# Patient Record
Sex: Female | Born: 2000 | Race: White | Hispanic: No | Smoking: Current every day smoker
Health system: Southern US, Community
[De-identification: ages and names within clinical notes are randomized; demographics above are authoritative.]

## PROBLEM LIST (undated history)

## (undated) DIAGNOSIS — S93409A Sprain of unspecified ligament of unspecified ankle, initial encounter: Secondary | ICD-10-CM

## (undated) DIAGNOSIS — F99 Mental disorder, not otherwise specified: Secondary | ICD-10-CM

## (undated) HISTORY — DX: Mental disorder, not otherwise specified: F99

## (undated) HISTORY — PX: TONSILLECTOMY AND ADENOIDECTOMY: SUR1326

---
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
## 2022-01-02 ENCOUNTER — Encounter (HOSPITAL_COMMUNITY): Payer: Self-pay

## 2022-01-02 ENCOUNTER — Emergency Department (HOSPITAL_COMMUNITY)
Admission: EM | Admit: 2022-01-02 | Discharge: 2022-01-03 | Disposition: A | Discharge status: Home / Self Care | Payer: Medicaid Other | Attending: Emergency Medicine | Admitting: Emergency Medicine

## 2022-01-02 ENCOUNTER — Other Ambulatory Visit: Payer: Self-pay

## 2022-01-02 DIAGNOSIS — U071 COVID-19: Secondary | ICD-10-CM | POA: Diagnosis not present

## 2022-01-02 DIAGNOSIS — M533 Sacrococcygeal disorders, not elsewhere classified: Secondary | ICD-10-CM

## 2022-01-02 DIAGNOSIS — M546 Pain in thoracic spine: Secondary | ICD-10-CM | POA: Diagnosis present

## 2022-01-02 NOTE — ED Triage Notes (Signed)
Pt reports lower back pain that started today.  Pt had a positive at home covid test.  Pt is alert and oriented.  Resp even and unlabored.  Reports fever.  Denies taking any meds for fever.  Reports took cough meds pta.   ?

## 2022-01-03 DIAGNOSIS — R509 Fever, unspecified: Secondary | ICD-10-CM | POA: Diagnosis not present

## 2022-01-03 DIAGNOSIS — U071 COVID-19: Secondary | ICD-10-CM | POA: Diagnosis not present

## 2022-01-03 LAB — CBC WITH DIFFERENTIAL/PLATELET
Abs Immature Granulocytes: 0.02 10*3/uL (ref 0.00–0.07)
Basophils Absolute: 0.1 10*3/uL (ref 0.0–0.1)
Basophils Relative: 1 %
Eosinophils Absolute: 0.1 10*3/uL (ref 0.0–0.5)
Eosinophils Relative: 1 %
HCT: 35.8 % — ABNORMAL LOW (ref 36.0–46.0)
Hemoglobin: 12 g/dL (ref 12.0–15.0)
Immature Granulocytes: 0 %
Lymphocytes Relative: 5 %
Lymphs Abs: 0.3 10*3/uL — ABNORMAL LOW (ref 0.7–4.0)
MCH: 28.2 pg (ref 26.0–34.0)
MCHC: 33.5 g/dL (ref 30.0–36.0)
MCV: 84 fL (ref 80.0–100.0)
Monocytes Absolute: 0.9 10*3/uL (ref 0.1–1.0)
Monocytes Relative: 12 %
Neutro Abs: 5.8 10*3/uL (ref 1.7–7.7)
Neutrophils Relative %: 81 %
Platelets: 242 10*3/uL (ref 150–400)
RBC: 4.26 MIL/uL (ref 3.87–5.11)
RDW: 13.3 % (ref 11.5–15.5)
WBC: 7.1 10*3/uL (ref 4.0–10.5)
nRBC: 0 % (ref 0.0–0.2)

## 2022-01-03 LAB — URINALYSIS, ROUTINE W REFLEX MICROSCOPIC
Bilirubin Urine: NEGATIVE
Glucose, UA: NEGATIVE mg/dL
Ketones, ur: 5 mg/dL — AB
Leukocytes,Ua: NEGATIVE
Nitrite: NEGATIVE
Protein, ur: NEGATIVE mg/dL
Specific Gravity, Urine: 1.012 (ref 1.005–1.030)
pH: 6 (ref 5.0–8.0)

## 2022-01-03 LAB — COMPREHENSIVE METABOLIC PANEL
ALT: 16 U/L (ref 0–44)
AST: 20 U/L (ref 15–41)
Albumin: 4.6 g/dL (ref 3.5–5.0)
Alkaline Phosphatase: 61 U/L (ref 38–126)
Anion gap: 8 (ref 5–15)
BUN: 8 mg/dL (ref 6–20)
CO2: 22 mmol/L (ref 22–32)
Calcium: 9.3 mg/dL (ref 8.9–10.3)
Chloride: 105 mmol/L (ref 98–111)
Creatinine, Ser: 0.84 mg/dL (ref 0.44–1.00)
GFR, Estimated: >60 mL/min (ref >60)
Glucose, Bld: 101 mg/dL — ABNORMAL HIGH (ref 70–99)
Potassium: 3.8 mmol/L (ref 3.5–5.1)
Sodium: 135 mmol/L (ref 135–145)
Total Bilirubin: 1 mg/dL (ref 0.3–1.2)
Total Protein: 8 g/dL (ref 6.5–8.1)

## 2022-01-03 LAB — RESP PANEL BY RT-PCR (FLU A&B, COVID) ARPGX2
Influenza A by PCR: NEGATIVE
Influenza B by PCR: NEGATIVE
SARS Coronavirus 2 by RT PCR: POSITIVE — AB

## 2022-01-03 LAB — I-STAT BETA HCG BLOOD, ED (MC, WL, AP ONLY): I-stat hCG, quantitative: <5 m[IU]/mL (ref <5)

## 2022-01-03 MED ORDER — LACTATED RINGERS IV BOLUS
1000.0000 mL | Freq: Once | INTRAVENOUS | Status: AC
  Administered 2022-01-03: 1000 mL via INTRAVENOUS

## 2022-01-03 MED ORDER — KETOROLAC TROMETHAMINE 30 MG/ML IJ SOLN
15.0000 mg | Freq: Once | INTRAMUSCULAR | Status: AC
  Administered 2022-01-03: 15 mg via INTRAVENOUS
  Filled 2022-01-03: qty 1

## 2022-01-03 NOTE — ED Provider Notes (Signed)
?[place] ?Provider Note ? ? ?CSN: [id number] ?Arrival date & time: 01/02/22  2251 ? ?  ? ?History ? ?Chief Complaint  ?Patient presents with  ? Back Pain  ? ? ?Stacey Bates is a 21 y.o. female. ? ?21 year old female who presents to the ER today for back pain.  Patient states that she was doing okay not feeling her best self not eating and drink as much is normal today.  She had breakfast but nothing else like that.  She works as a Educational psychologist.  She was at work when she had worsening body aches and was found of a fever her family took her COVID test was positive.  She continued to feel bad so she came home.  She is up for few hours and then called her parents because her lower back hurts significantly so she wanted to come to the hospital.  She states that she has pain in the lower mid back that is pretty severe in nature.  Worse with twisting and movement.  Better with rest.  Did not try thing at home for symptoms.  No urinary symptoms, vaginal symptoms.  Without the family in the room I discussed with her no alcohol, drugs, tobacco or other associated symptoms. ? ? ?Back Pain ? ?  ? ?Home Medications ?Prior to Admission medications   ?Medication Sig Start Date End Date Taking? Authorizing Provider  ?doxepin (SINEQUAN) 10 MG capsule Take 10 mg by mouth at bedtime. 08/03/19   [provider]  ?escitalopram (LEXAPRO) 20 MG tablet Take 20 mg by mouth daily. 08/03/19   [provider]  ?etonogestrel (NEXPLANON) 68 MG IMPL implant 1 each by Subdermal route once.    [provider]  ?lamoTRIgine (LAMICTAL) 25 MG tablet Take 50 mg by mouth at bedtime. 08/31/19   [provider]  ?traZODone (DESYREL) 100 MG tablet Take 100 mg by mouth at bedtime. 08/31/19   [provider]  ?   ? ?Allergies    ?Cephalosporins and Sulfa antibiotics   ? ?Review of Systems   ?Review of Systems  ?Musculoskeletal:  Positive for back pain.  ? ?Physical Exam ?Updated Vital  Signs ?BP 133/75 (BP Location: Right Arm)   Pulse 93   Temp (!) 100.8 ?F (38.2 ?C) (Oral)   Resp 19   Ht 5\' 3"  (1.6 m)   Wt 49.9 kg   SpO2 98%   BMI 19.49 kg/m?  ?Physical Exam ?Vitals and nursing note reviewed.  ?Constitutional:   ?   Appearance: She is well-developed.  ?HENT:  ?   Head: Normocephalic and atraumatic.  ?   Mouth/Throat:  ?   Mouth: Mucous membranes are moist.  ?   Pharynx: Oropharynx is clear.  ?Eyes:  ?   Pupils: Pupils are equal, round, and reactive to light.  ?Cardiovascular:  ?   Rate and Rhythm: Normal rate and regular rhythm.  ?Pulmonary:  ?   Effort: No respiratory distress.  ?   Breath sounds: No stridor.  ?Abdominal:  ?   General: Abdomen is flat. There is no distension.  ?Musculoskeletal:     ?   General: No swelling or tenderness. Normal range of motion.  ?   Cervical back: Normal range of motion.  ?Skin: ?   General: Skin is warm and dry.  ?Neurological:  ?   General: No focal deficit present.  ?   Mental Status: She is alert.  ? ? ?ED Results / Procedures / Treatments   ?  Labs ?(all labs ordered are listed, but only abnormal results are displayed) ?Labs Reviewed  ?RESP PANEL BY RT-PCR (FLU A&B, COVID) ARPGX2 - Abnormal; Notable for the following components:  ?    Result Value  ? SARS Coronavirus 2 by RT PCR POSITIVE (*)   ? All other components within normal limits  ?CBC WITH DIFFERENTIAL/PLATELET - Abnormal; Notable for the following components:  ? HCT 35.8 (*)   ? Lymphs Abs 0.3 (*)   ? All other components within normal limits  ?COMPREHENSIVE METABOLIC PANEL - Abnormal; Notable for the following components:  ? Glucose, Bld 101 (*)   ? All other components within normal limits  ?URINALYSIS, ROUTINE W REFLEX MICROSCOPIC - Abnormal; Notable for the following components:  ? Hgb urine dipstick SMALL (*)   ? Ketones, ur 5 (*)   ? Bacteria, UA RARE (*)   ? All other components within normal limits  ?I-STAT BETA HCG BLOOD, ED ([facility], [facility], [facility] ONLY)  ? ? ?EKG ?None ? ?Radiology ?No results  found. ? ?Procedures ?Procedures  ? ? ?Medications Ordered in ED ?Medications  ?lactated ringers bolus 1,000 mL (0 mLs Intravenous Stopped 01/03/22 0333)  ?ketorolac (TORADOL) 30 MG/ML injection 15 mg (15 mg Intravenous Given 01/03/22 0157)  ?lactated ringers bolus 1,000 mL (0 mLs Intravenous Stopped 01/03/22 0417)  ? ? ?ED Course/ Medical Decision Making/ A&P ?  ?                        ?Medical Decision Making ?Amount and/or Complexity of Data Reviewed ?Labs: ordered. ? ?Risk ?Prescription drug management. ? ? ?Does have covid.  No trauma to suggest need for imaging.  No GI symptoms to suggest constipation, abscess, colitis or other GI issues.  No rashes.  No urinary or vaginal symptoms to suggest ectopic pregnancy.  Could very well just be muscular soreness.  Probably dehydrated from not eating drinking much today and having a fever with COVID.  We will give some fluids and some Toradol and reassess. ? ?Reassessment patient symptoms are significantly improved.  Still negative to urine sample.  Pregnancy test is negative.  Rest of work-up is pretty reassuring. Stable for discharge.  ? ? ?Final Clinical Impression(s) / ED Diagnoses ?Final diagnoses:  ?Sacral pain  ?COVID-19  ? ? ?Rx / DC Orders ?ED Discharge Orders   ? ? None  ? ?  ? ? ?  ?Merrily Pew, MD ?01/03/22 [phone] ? ?

## 2022-02-17 ENCOUNTER — Ambulatory Visit: Payer: Medicaid Other | Admitting: Adult Health

## 2022-02-17 DIAGNOSIS — M25511 Pain in right shoulder: Secondary | ICD-10-CM | POA: Diagnosis not present

## 2022-02-17 DIAGNOSIS — M25512 Pain in left shoulder: Secondary | ICD-10-CM | POA: Diagnosis not present

## 2022-02-17 DIAGNOSIS — L559 Sunburn, unspecified: Secondary | ICD-10-CM | POA: Diagnosis not present

## 2022-02-23 ENCOUNTER — Emergency Department (HOSPITAL_COMMUNITY)
Admission: EM | Admit: 2022-02-23 | Discharge: 2022-02-24 | Disposition: A | Discharge status: Home / Self Care | Payer: Medicaid Other | Attending: Emergency Medicine | Admitting: Emergency Medicine

## 2022-02-23 ENCOUNTER — Other Ambulatory Visit: Payer: Self-pay

## 2022-02-23 ENCOUNTER — Encounter (HOSPITAL_COMMUNITY): Payer: Self-pay

## 2022-02-23 ENCOUNTER — Emergency Department (HOSPITAL_COMMUNITY): Payer: Medicaid Other

## 2022-02-23 DIAGNOSIS — D72829 Elevated white blood cell count, unspecified: Secondary | ICD-10-CM | POA: Insufficient documentation

## 2022-02-23 DIAGNOSIS — R109 Unspecified abdominal pain: Secondary | ICD-10-CM | POA: Diagnosis present

## 2022-02-23 DIAGNOSIS — N200 Calculus of kidney: Secondary | ICD-10-CM | POA: Insufficient documentation

## 2022-02-23 LAB — COMPREHENSIVE METABOLIC PANEL
ALT: 14 U/L (ref 0–44)
AST: 17 U/L (ref 15–41)
Albumin: 4.1 g/dL (ref 3.5–5.0)
Alkaline Phosphatase: 52 U/L (ref 38–126)
Anion gap: 7 (ref 5–15)
BUN: 9 mg/dL (ref 6–20)
CO2: 25 mmol/L (ref 22–32)
Calcium: 9.1 mg/dL (ref 8.9–10.3)
Chloride: 106 mmol/L (ref 98–111)
Creatinine, Ser: 0.93 mg/dL (ref 0.44–1.00)
GFR, Estimated: >60 mL/min (ref >60)
Glucose, Bld: 93 mg/dL (ref 70–99)
Potassium: 3.5 mmol/L (ref 3.5–5.1)
Sodium: 138 mmol/L (ref 135–145)
Total Bilirubin: 0.6 mg/dL (ref 0.3–1.2)
Total Protein: 7.4 g/dL (ref 6.5–8.1)

## 2022-02-23 LAB — URINALYSIS, ROUTINE W REFLEX MICROSCOPIC
Bilirubin Urine: NEGATIVE
Glucose, UA: NEGATIVE mg/dL
Ketones, ur: NEGATIVE mg/dL
Leukocytes,Ua: NEGATIVE
Nitrite: NEGATIVE
Protein, ur: NEGATIVE mg/dL
Specific Gravity, Urine: 1.017 (ref 1.005–1.030)
pH: 5 (ref 5.0–8.0)

## 2022-02-23 LAB — CBC WITH DIFFERENTIAL/PLATELET
Abs Immature Granulocytes: 0.04 10*3/uL (ref 0.00–0.07)
Basophils Absolute: 0.1 10*3/uL (ref 0.0–0.1)
Basophils Relative: 1 %
Eosinophils Absolute: 0.3 10*3/uL (ref 0.0–0.5)
Eosinophils Relative: 2 %
HCT: 36 % (ref 36.0–46.0)
Hemoglobin: 12 g/dL (ref 12.0–15.0)
Immature Granulocytes: 0 %
Lymphocytes Relative: 20 %
Lymphs Abs: 2.3 10*3/uL (ref 0.7–4.0)
MCH: 28.8 pg (ref 26.0–34.0)
MCHC: 33.3 g/dL (ref 30.0–36.0)
MCV: 86.5 fL (ref 80.0–100.0)
Monocytes Absolute: 0.9 10*3/uL (ref 0.1–1.0)
Monocytes Relative: 7 %
Neutro Abs: 7.9 10*3/uL — ABNORMAL HIGH (ref 1.7–7.7)
Neutrophils Relative %: 70 %
Platelets: 287 10*3/uL (ref 150–400)
RBC: 4.16 MIL/uL (ref 3.87–5.11)
RDW: 13.3 % (ref 11.5–15.5)
WBC: 11.5 10*3/uL — ABNORMAL HIGH (ref 4.0–10.5)
nRBC: 0 % (ref 0.0–0.2)

## 2022-02-23 LAB — PREGNANCY, URINE: Preg Test, Ur: NEGATIVE

## 2022-02-23 LAB — LIPASE, BLOOD: Lipase: 28 U/L (ref 11–51)

## 2022-02-23 MED ORDER — HYDROCODONE-ACETAMINOPHEN 5-325 MG PO TABS: 1.0000 | ORAL_TABLET | Freq: Four times a day (QID) | ORAL | 0 refills | Status: DC | PRN

## 2022-02-23 MED ORDER — IOHEXOL 300 MG/ML  SOLN
75.0000 mL | Freq: Once | INTRAMUSCULAR | Status: AC | PRN
Start: 2022-02-23 — End: 2022-02-23
  Administered 2022-02-23: 75 mL via INTRAVENOUS

## 2022-02-23 MED ORDER — FENTANYL CITRATE PF 50 MCG/ML IJ SOSY
50.0000 ug | PREFILLED_SYRINGE | Freq: Once | INTRAMUSCULAR | Status: AC
  Administered 2022-02-23: 50 ug via INTRAVENOUS
  Filled 2022-02-23: qty 1

## 2022-02-23 MED ORDER — TAMSULOSIN HCL 0.4 MG PO CAPS
0.4000 mg | ORAL_CAPSULE | Freq: Once | ORAL | Status: AC
  Administered 2022-02-23: 0.4 mg via ORAL
  Filled 2022-02-23: qty 1

## 2022-02-23 MED ORDER — TAMSULOSIN HCL 0.4 MG PO CAPS: 0.4000 mg | ORAL_CAPSULE | Freq: Every day | ORAL | 0 refills | Status: DC

## 2022-02-23 MED ORDER — KETOROLAC TROMETHAMINE 30 MG/ML IJ SOLN
30.0000 mg | Freq: Once | INTRAMUSCULAR | Status: AC
Start: 2022-02-23 — End: 2022-02-23
  Administered 2022-02-23: 30 mg via INTRAVENOUS
  Filled 2022-02-23: qty 1

## 2022-02-23 MED ORDER — ONDANSETRON HCL 4 MG/2ML IJ SOLN
4.0000 mg | Freq: Once | INTRAMUSCULAR | Status: AC
  Administered 2022-02-23: 4 mg via INTRAVENOUS
  Filled 2022-02-23: qty 2

## 2022-02-23 MED ORDER — ONDANSETRON HCL 4 MG PO TABS: 4.0000 mg | ORAL_TABLET | Freq: Three times a day (TID) | ORAL | 0 refills | Status: DC | PRN

## 2022-02-23 MED ORDER — SODIUM CHLORIDE 0.9 % IV BOLUS
1000.0000 mL | Freq: Once | INTRAVENOUS | Status: AC
  Administered 2022-02-23: 1000 mL via INTRAVENOUS

## 2022-02-23 NOTE — Discharge Instructions (Addendum)
Please return to the ED with any new symptoms such as inability to urinate, decreased urine stream, fevers Please follow-up with urologist, Dr. Ronne Binning, for further management Please continue to push fluids and remain hydrated.  Urinating often helps the stone move along. Please read attached informational guide concerning dietary guidelines to help prevent kidney stones as well as informational brochure on kidney stones Please take tamsulosin 1 time daily

## 2022-02-23 NOTE — ED Provider Notes (Addendum)
[name] EMERGENCY DEPARTMENT Provider Note   CSN: [id number] Arrival date & time: 02/23/22  1932     History  Chief Complaint  Patient presents with   Flank Pain    Pelvic    Stacey Bates is a 21 y.o. female with no relevant medical history.  The patient presents to the ED for evaluation of left-sided groin, flank pain described as cramping.  Patient reports that beginning Friday she woke up and had left-sided groin pain causing her to have 4 episodes of nausea and vomiting.  The patient states that this pain decreased throughout the day, she went to work and worked a full shift.  The patient reports that later that night the pain returned and ever since Friday night the pain has been constant.  The patient states that the pain has gradually worked its way around her left flank localizing on the left side of her back.  Patient states that her mother has a history of ovarian cyst however the patient denies any history of ovarian cyst.  Patient denies history of kidney stones.  The patient reports that she recently got off of her menstrual period on Wednesday.  Patient is also endorsing vaginal discharge, she states that the vaginal discharge is not colored, does not have an odor, is typical for her.  Patient denies any fevers, bodyaches or chills, diarrhea, decreased urine, blood in urine, dysuria, dyspareunia.   Flank Pain Pertinent negatives include no abdominal pain.       Home Medications Prior to Admission medications   Medication Sig Start Date End Date Taking? Authorizing Provider  HYDROcodone-acetaminophen (NORCO/VICODIN) 5-325 MG tablet Take 1 tablet by mouth every 6 (six) hours as needed for severe pain. 02/23/22  Yes Al Decant, PA-C  ondansetron (ZOFRAN) 4 MG tablet Take 1 tablet (4 mg total) by mouth every 8 (eight) hours as needed for nausea or vomiting. 02/23/22  Yes Al Decant, PA-C  tamsulosin (FLOMAX) 0.4 MG CAPS capsule Take 1 capsule  (0.4 mg total) by mouth daily after breakfast. 02/23/22  Yes Al Decant, PA-C  doxepin (SINEQUAN) 10 MG capsule Take 10 mg by mouth at bedtime. 08/03/19   [provider]  escitalopram (LEXAPRO) 20 MG tablet Take 20 mg by mouth daily. 08/03/19   [provider]  etonogestrel (NEXPLANON) 68 MG IMPL implant 1 each by Subdermal route once.    [provider]  lamoTRIgine (LAMICTAL) 25 MG tablet Take 50 mg by mouth at bedtime. 08/31/19   [provider]  traZODone (DESYREL) 100 MG tablet Take 100 mg by mouth at bedtime. 08/31/19   [provider]      Allergies    Cephalosporins and Sulfa antibiotics    Review of Systems   Review of Systems  Constitutional:  Negative for chills and fever.  Gastrointestinal:  Positive for nausea and vomiting. Negative for abdominal pain and diarrhea.  Genitourinary:  Positive for flank pain and vaginal discharge. Negative for decreased urine volume, difficulty urinating, dyspareunia, dysuria and hematuria.  All other systems reviewed and are negative.   Physical Exam Updated Vital Signs BP 111/72   Pulse 83   Temp 98.6 F (37 C) (Oral)   Resp 18   Ht 5\' 3"  (1.6 m)   Wt 50 kg   LMP 02/20/2022   SpO2 96%   BMI 19.53 kg/m  Physical Exam Vitals and nursing note reviewed.  Constitutional:      General: She is  not in acute distress.    Appearance: Normal appearance. She is not ill-appearing, toxic-appearing or diaphoretic.  HENT:     Head: Normocephalic and atraumatic.     Nose: Nose normal. No congestion.     Mouth/Throat:     Mouth: Mucous membranes are moist.     Pharynx: Oropharynx is clear. No oropharyngeal exudate or posterior oropharyngeal erythema.  Eyes:     Extraocular Movements: Extraocular movements intact.     Conjunctiva/sclera: Conjunctivae normal.     Pupils: Pupils are equal, round, and reactive to light.  Cardiovascular:     Rate and Rhythm: Normal rate and regular rhythm.   Pulmonary:     Effort: Pulmonary effort is normal.     Breath sounds: Normal breath sounds. No wheezing.  Abdominal:     General: Abdomen is flat. Bowel sounds are normal. There is no distension.     Palpations: Abdomen is soft.     Tenderness: There is no abdominal tenderness. There is left CVA tenderness. There is no right CVA tenderness or guarding.  Musculoskeletal:     Cervical back: Normal range of motion and neck supple. No rigidity or tenderness.  Skin:    General: Skin is warm and dry.     Capillary Refill: Capillary refill takes less than 2 seconds.  Neurological:     Mental Status: She is alert and oriented to person, place, and time.     ED Results / Procedures / Treatments   Labs (all labs ordered are listed, but only abnormal results are displayed) Labs Reviewed  CBC WITH DIFFERENTIAL/PLATELET - Abnormal; Notable for the following components:      Result Value   WBC 11.5 (*)    Neutro Abs 7.9 (*)    All other components within normal limits  URINALYSIS, ROUTINE W REFLEX MICROSCOPIC - Abnormal; Notable for the following components:   Hgb urine dipstick MODERATE (*)    Bacteria, UA RARE (*)    All other components within normal limits  URINE CULTURE  PREGNANCY, URINE  COMPREHENSIVE METABOLIC PANEL  LIPASE, BLOOD    EKG None  Radiology CT ABDOMEN PELVIS W CONTRAST  Result Date: 02/23/2022 CLINICAL DATA:  Left flank pain and lower left pelvic pain, kidney stone suspected. EXAM: CT ABDOMEN AND PELVIS WITH CONTRAST TECHNIQUE: Multidetector CT imaging of the abdomen and pelvis was performed using the standard protocol following bolus administration of intravenous contrast. RADIATION DOSE REDUCTION: This exam was performed according to the departmental dose-optimization program which includes automated exposure control, adjustment of the mA and/or kV according to patient size and/or use of iterative reconstruction technique. CONTRAST:  59mL OMNIPAQUE IOHEXOL 300  MG/ML  SOLN COMPARISON:  None Available. FINDINGS: Lower chest: No acute abnormality. Hepatobiliary: No focal liver abnormality is seen. No gallstones, gallbladder wall thickening, or biliary dilatation. Pancreas: Unremarkable. No pancreatic ductal dilatation or surrounding inflammatory changes. Spleen: Normal in size without focal abnormality. Adrenals/Urinary Tract: Adrenal glands are unremarkable. The kidneys enhance symmetrically. No renal calculus or hydronephrosis bilaterally. A 5 mm calcification is present in the anticipated region of the mid left ureter without evidence of obstructive uropathy. Stomach/Bowel: Stomach is within normal limits. Appendix appears normal. No evidence of bowel wall thickening, distention, or inflammatory changes. No free air or pneumatosis. Vascular/Lymphatic: No significant vascular findings are present. No enlarged abdominal or pelvic lymph nodes. Reproductive: Uterus and bilateral adnexa are unremarkable. Other: A trace amount of free fluid is noted in the pelvis which is likely physiologic. Musculoskeletal: No  acute or significant osseous findings. IMPRESSION: 5 mm calculus in the anticipated region of the mid left ureter with no evidence of obstructive uropathy. Electronically Signed   By: Thornell Sartorius M.D.   On: 02/23/2022 22:47    Procedures Procedures   Medications Ordered in ED Medications  tamsulosin (FLOMAX) capsule 0.4 mg (has no administration in time range)  fentaNYL (SUBLIMAZE) injection 50 mcg (has no administration in time range)  sodium chloride 0.9 % bolus 1,000 mL (1,000 mLs Intravenous New Bag/Given 02/23/22 2220)  ondansetron (ZOFRAN) injection 4 mg (4 mg Intravenous Given 02/23/22 2221)  ketorolac (TORADOL) 30 MG/ML injection 30 mg (30 mg Intravenous Given 02/23/22 2221)  iohexol (OMNIPAQUE) 300 MG/ML solution 75 mL (75 mLs Intravenous Contrast Given 02/23/22 2230)    ED Course/ Medical Decision Making/ A&P                           Medical  Decision Making Amount and/or Complexity of Data Reviewed Labs: ordered. Radiology: ordered.  Risk Prescription drug management.   21 year old female presents to ED for evaluation of left flank pain.  Please see HPI for further details.  On examination, patient afebrile and nontachycardic.  Patient lung sounds clear bilaterally, not hypoxic on room air.  The patient's abdomen is soft and compressible in all 4 quadrants.  The patient has left-sided CVA tenderness.  Patient follows commands appropriately, nontoxic in appearance.  Patient worked up utilizing the following labs and imaging studies interpreted by me personally: - Lipase unremarkable - CMP unremarkable, no elevated creatinine - Pregnancy test negative - Urinalysis shows moderate hemoglobin, rare bacteria.  The patient's urine culture has been sent off - CBC has slight leukocytosis of 11.5 - CT abdomen pelvis shows 5 mm nephrolithiasis in the mid left ureter.  Most likely cause of patient pain. CT abdomen pelvis with contrast was obtained over CT renal stone study so possibility of ovarian torsion could be better assessed as we do not have ultrasound tech here at this time.   Patient treated with 1 L normal saline, 30 mg Toradol injection, 4 mg of Zofran.  Patient work-up is revealing for kidney stone.  There is low concern for infected kidney stone at this point due to only slight leukocytosis, afebrile, no elevated creatinine.  The patient will be referred to Dr. Ronne Binning, urology, for further management.  Patient will be sent home with pain medication, Zofran and expulsive therapy.  Prior to discharge, patient provided with 50 mcg fentanyl injection for pain control throughout the night as well as first dose of tamsulosin due to the fact that her pharmacy is closed at this hour.  Patient provided with return precautions and she voiced understanding.  The patient had all of her questions answered to her satisfaction.  The  patient is stable this time for discharge home.  Final Clinical Impression(s) / ED Diagnoses Final diagnoses:  Nephrolithiasis    Rx / DC Orders ED Discharge Orders          Ordered    tamsulosin (FLOMAX) 0.4 MG CAPS capsule  Daily after breakfast        02/23/22 2257    HYDROcodone-acetaminophen (NORCO/VICODIN) 5-325 MG tablet  Every 6 hours PRN        02/23/22 2257    ondansetron (ZOFRAN) 4 MG tablet  Every 8 hours PRN        02/23/22 2257  Al Decant, PA-C 02/23/22 8299    Eber Hong, MD 02/26/22 1115

## 2022-02-25 ENCOUNTER — Telehealth (HOSPITAL_COMMUNITY): Payer: Self-pay | Admitting: Emergency Medicine

## 2022-02-25 LAB — URINE CULTURE: Culture: <10000 — AB

## 2022-02-25 MED ORDER — HYDROCODONE-ACETAMINOPHEN 5-325 MG PO TABS: 1.0000 | ORAL_TABLET | Freq: Four times a day (QID) | ORAL | 0 refills | Status: DC | PRN

## 2022-02-26 NOTE — Telephone Encounter (Signed)
Meds sent to [facility] as other pharmacy was out of opiates

## 2022-03-20 ENCOUNTER — Emergency Department (HOSPITAL_COMMUNITY): Payer: Medicaid Other

## 2022-03-20 ENCOUNTER — Encounter (HOSPITAL_COMMUNITY): Payer: Self-pay

## 2022-03-20 ENCOUNTER — Emergency Department (HOSPITAL_COMMUNITY)
Admission: EM | Admit: 2022-03-20 | Discharge: 2022-03-20 | Disposition: A | Discharge status: Home / Self Care | Payer: Medicaid Other | Attending: Emergency Medicine | Admitting: Emergency Medicine

## 2022-03-20 ENCOUNTER — Other Ambulatory Visit: Payer: Self-pay

## 2022-03-20 DIAGNOSIS — R109 Unspecified abdominal pain: Secondary | ICD-10-CM | POA: Diagnosis present

## 2022-03-20 DIAGNOSIS — N201 Calculus of ureter: Secondary | ICD-10-CM | POA: Diagnosis not present

## 2022-03-20 LAB — CBC WITH DIFFERENTIAL/PLATELET
Abs Immature Granulocytes: 0.02 10*3/uL (ref 0.00–0.07)
Basophils Absolute: 0.1 10*3/uL (ref 0.0–0.1)
Basophils Relative: 1 %
Eosinophils Absolute: 0.3 10*3/uL (ref 0.0–0.5)
Eosinophils Relative: 4 %
HCT: 37.7 % (ref 36.0–46.0)
Hemoglobin: 12.2 g/dL (ref 12.0–15.0)
Immature Granulocytes: 0 %
Lymphocytes Relative: 31 %
Lymphs Abs: 2.4 10*3/uL (ref 0.7–4.0)
MCH: 28 pg (ref 26.0–34.0)
MCHC: 32.4 g/dL (ref 30.0–36.0)
MCV: 86.5 fL (ref 80.0–100.0)
Monocytes Absolute: 0.5 10*3/uL (ref 0.1–1.0)
Monocytes Relative: 7 %
Neutro Abs: 4.3 10*3/uL (ref 1.7–7.7)
Neutrophils Relative %: 57 %
Platelets: 289 10*3/uL (ref 150–400)
RBC: 4.36 MIL/uL (ref 3.87–5.11)
RDW: 13.2 % (ref 11.5–15.5)
WBC: 7.5 10*3/uL (ref 4.0–10.5)
nRBC: 0 % (ref 0.0–0.2)

## 2022-03-20 LAB — BASIC METABOLIC PANEL
Anion gap: 7 (ref 5–15)
BUN: 9 mg/dL (ref 6–20)
CO2: 26 mmol/L (ref 22–32)
Calcium: 9.4 mg/dL (ref 8.9–10.3)
Chloride: 106 mmol/L (ref 98–111)
Creatinine, Ser: 0.88 mg/dL (ref 0.44–1.00)
GFR, Estimated: >60 mL/min (ref >60)
Glucose, Bld: 96 mg/dL (ref 70–99)
Potassium: 3.9 mmol/L (ref 3.5–5.1)
Sodium: 139 mmol/L (ref 135–145)

## 2022-03-20 LAB — URINALYSIS, ROUTINE W REFLEX MICROSCOPIC
Bilirubin Urine: NEGATIVE
Glucose, UA: NEGATIVE mg/dL
Hgb urine dipstick: NEGATIVE
Ketones, ur: NEGATIVE mg/dL
Nitrite: NEGATIVE
Protein, ur: NEGATIVE mg/dL
Specific Gravity, Urine: 1.013 (ref 1.005–1.030)
pH: 7 (ref 5.0–8.0)

## 2022-03-20 LAB — POC URINE PREG, ED: Preg Test, Ur: NEGATIVE

## 2022-03-20 MED ORDER — ONDANSETRON HCL 4 MG/2ML IJ SOLN
4.0000 mg | Freq: Once | INTRAMUSCULAR | Status: AC
  Administered 2022-03-20: 4 mg via INTRAVENOUS
  Filled 2022-03-20: qty 2

## 2022-03-20 MED ORDER — KETOROLAC TROMETHAMINE 30 MG/ML IJ SOLN
15.0000 mg | Freq: Once | INTRAMUSCULAR | Status: AC
  Administered 2022-03-20: 15 mg via INTRAVENOUS
  Filled 2022-03-20: qty 1

## 2022-03-20 MED ORDER — HYDROMORPHONE HCL 1 MG/ML IJ SOLN
0.5000 mg | Freq: Once | INTRAMUSCULAR | Status: AC
  Administered 2022-03-20: 0.5 mg via INTRAVENOUS
  Filled 2022-03-20: qty 0.5

## 2022-03-20 MED ORDER — TAMSULOSIN HCL 0.4 MG PO CAPS: 0.4000 mg | ORAL_CAPSULE | Freq: Every day | ORAL | 0 refills | Status: DC

## 2022-03-20 MED ORDER — OXYCODONE-ACETAMINOPHEN 5-325 MG PO TABS: 1.0000 | ORAL_TABLET | Freq: Four times a day (QID) | ORAL | 0 refills | Status: DC | PRN

## 2022-03-20 NOTE — ED Provider Notes (Signed)
[facility] EMERGENCY DEPARTMENT Provider Note   CSN: [id number] Arrival date & time: 03/20/22  4098     History  Chief Complaint  Patient presents with   Flank Pain    Stacey Bates is a 21 y.o. female with no other significant past medical history except she was diagnosed here on June 25 with a left 5 mm ureteral kidney stone presenting with persistent pain, hematuria and urinary frequency, now describing dysuria as well.  She has had no fevers or chills, she does have nausea with emesis when her pain is severe.  Her pain was initially in her left flank region but since her last visit it has traveled and now is in her left lower pelvic region and has been persistently there for days.  She denies fevers or chills.  She has taken ibuprofen and Tylenol with no symptom relief.  She was prescribed hydrocodone and Flomax on June 25.  She states the hydrocodone was equivocally helpful  The history is provided by the patient and a relative (grandmother at bedside).       Home Medications Prior to Admission medications   Medication Sig Start Date End Date Taking? Authorizing Provider  etonogestrel (NEXPLANON) 68 MG IMPL implant 1 each by Subdermal route once.   Yes [provider]  oxyCODONE-acetaminophen (PERCOCET/ROXICET) 5-325 MG tablet Take 1 tablet by mouth every 6 (six) hours as needed. 03/20/22  Yes Roan Miklos, Raynelle Fanning, PA-C  tamsulosin (FLOMAX) 0.4 MG CAPS capsule Take 1 capsule (0.4 mg total) by mouth daily after supper. 03/20/22  Yes Nicol Herbig, Raynelle Fanning, PA-C  doxepin (SINEQUAN) 10 MG capsule Take 10 mg by mouth at bedtime. Patient not taking: Reported on 03/20/2022 08/03/19   [provider]  escitalopram (LEXAPRO) 20 MG tablet Take 20 mg by mouth daily. Patient not taking: Reported on 03/20/2022 08/03/19   [provider]  HYDROcodone-acetaminophen (NORCO/VICODIN) 5-325 MG tablet Take 1 tablet by mouth every 6 (six) hours as needed. Patient not taking: Reported on  03/20/2022 02/25/22   Eber Hong, MD  lamoTRIgine (LAMICTAL) 25 MG tablet Take 50 mg by mouth at bedtime. Patient not taking: Reported on 03/20/2022 08/31/19   [provider]  ondansetron (ZOFRAN) 4 MG tablet Take 1 tablet (4 mg total) by mouth every 8 (eight) hours as needed for nausea or vomiting. Patient not taking: Reported on 03/20/2022 02/23/22   Al Decant, PA-C  traZODone (DESYREL) 100 MG tablet Take 100 mg by mouth at bedtime. Patient not taking: Reported on 03/20/2022 08/31/19   [provider]      Allergies    Cefdinir, Cephalosporins, and Sulfa antibiotics    Review of Systems   Review of Systems  Physical Exam Updated Vital Signs BP 113/64   Pulse (!) 49   Temp 98.3 F (36.8 C) (Oral)   Resp 16   Ht 5\' 3"  (1.6 m)   Wt 50 kg   LMP 03/18/2022 (Exact Date) Comment: Negative u-preg in ED  03/20/22  SpO2 100%   BMI 19.53 kg/m  Physical Exam  ED Results / Procedures / Treatments   Labs (all labs ordered are listed, but only abnormal results are displayed) Labs Reviewed  URINALYSIS, ROUTINE W REFLEX MICROSCOPIC - Abnormal; Notable for the following components:      Result Value   Leukocytes,Ua SMALL (*)    Bacteria, UA RARE (*)    All other components within normal limits  CBC WITH DIFFERENTIAL/PLATELET  BASIC METABOLIC PANEL  POC URINE PREG, ED  EKG None  Radiology DG Abdomen 1 View  Result Date: 03/20/2022 CLINICAL DATA:  Left-sided flank pain EXAM: ABDOMEN - 1 VIEW COMPARISON:  02/23/2022 FINDINGS: No bowel dilatation to suggest obstruction. No evidence of pneumoperitoneum, portal venous gas or pneumatosis. Possible 4 mm distal left ureteral calculus in the left side of the pelvis. No acute osseous abnormality. IMPRESSION: Possible 4 mm distal left ureteral calculus in the left side of the pelvis. Electronically Signed   By: Elige Ko M.D.   On: 03/20/2022 12:39   US Renal  Result Date: 03/20/2022 CLINICAL DATA:  Follow-up  left ureteral calculus.  Persistent pain. EXAM: RENAL / URINARY TRACT ULTRASOUND COMPLETE COMPARISON:  CT abdomen and pelvis 02/23/2022 FINDINGS: Right Kidney: Renal measurements: 12.7 x 3.7 x 4.4 cm = volume: 107 mL. Echogenicity within normal limits. A few small echogenic foci measure up to 6 mm in size, however these do not definitively reflect calculi as there is no substantial shadowing and no corresponding calculi were present on the recent prior CT. No mass or hydronephrosis visualized. Left Kidney: Renal measurements: 10.7 x 5.8 x 4.4 cm = volume: 141 mL. Echogenicity within normal limits. A few small echogenic foci measure up to 6 mm in size, however these do not definitively reflect calculi as there is no substantial shadowing and no corresponding calculi were present on the recent prior CT. There is mild fullness of the renal collecting system without frank hydronephrosis. No mass. Bladder: Appears normal for degree of bladder distention. Bilateral ureteral jets were visualized. Other: None. IMPRESSION: 1. Mild fullness of the left renal collecting system without frank hydronephrosis. 2. Scattered small echogenic foci in both kidneys which do not definitively reflect calculi. Electronically Signed   By: Sebastian Ache M.D.   On: 03/20/2022 10:40    Procedures Procedures    Medications Ordered in ED Medications  HYDROmorphone (DILAUDID) injection 0.5 mg (0.5 mg Intravenous Given 03/20/22 1027)  ondansetron (ZOFRAN) injection 4 mg (4 mg Intravenous Given 03/20/22 1027)  ketorolac (TORADOL) 30 MG/ML injection 15 mg (15 mg Intravenous Given 03/20/22 1301)    ED Course/ Medical Decision Making/ A&P                           Medical Decision Making Patient presenting with persistent left sided pain consistent with kidney stone, initially diagnosed June 25 but she has not yet passed the stone.  Also describing dysuria, fortunately her UA is negative for acute infection at this time.  Creatinine is  also normal today.  Ultrasound does not show severe hydronephrosis, KUB suggest a 4 mm stone left distal ureter.  Amount and/or Complexity of Data Reviewed Labs: ordered.    Details: Per above Radiology: ordered.    Details: Ultrasound suggesting mild fullness of the left collecting system without hydronephrosis.  KUB suggest 4 mm maximum stone left distal ureter. Discussion of management or test interpretation with external provider(s): Discussed patient with Dr. Pete Glatter of urology.  He has suggested continued Flomax, pain medication, close follow-up with his office early next week.  If patient has not passed the stone he will consider arranging lithotripsy for her at that time.  Toradol IV given also here x1 prior to discharge.  Risk Prescription drug management.           Final Clinical Impression(s) / ED Diagnoses Final diagnoses:  Ureterolithiasis    Rx / DC Orders ED Discharge Orders  Ordered    tamsulosin (FLOMAX) 0.4 MG CAPS capsule  Daily after supper        03/20/22 1304    oxyCODONE-acetaminophen (PERCOCET/ROXICET) 5-325 MG tablet  Every 6 hours PRN        03/20/22 1304              Burgess Amor, PA-C 03/20/22 1311    Cathren Laine, MD 03/20/22 1427

## 2022-03-20 NOTE — Discharge Instructions (Signed)
Strain your urine and save the stone if it should pass to take to your appointment with the urologist.  Call their office for an appointment early next week.  Do not drive within 4 hours of taking the oxycodone as this medication will make you drowsy.  Make sure you are drinking plenty of fluids.  Get rechecked immediately if you develop severe uncontrolled pain or fever.  If you have not passed the stone by next week Dr. Pete Glatter will probably be arranging lithotripsy for you at that time.

## 2022-03-20 NOTE — ED Triage Notes (Signed)
Pt to ED from home c/o left sided flank and abdominal pain for 2 weeks. Was seen here and dx with kidney stone, however, no relief since. C/o urinary frequency and hematuria.

## 2022-03-25 ENCOUNTER — Ambulatory Visit: Payer: Medicaid Other | Admitting: Urology

## 2022-04-02 ENCOUNTER — Encounter: Payer: Self-pay | Admitting: Urology

## 2022-04-02 ENCOUNTER — Ambulatory Visit (HOSPITAL_COMMUNITY)
Admission: RE | Admit: 2022-04-02 | Discharge: 2022-04-02 | Disposition: A | Discharge status: Home / Self Care | Payer: Medicaid Other | Source: Ambulatory Visit | Attending: Urology | Admitting: Urology

## 2022-04-02 ENCOUNTER — Ambulatory Visit (INDEPENDENT_AMBULATORY_CARE_PROVIDER_SITE_OTHER): Payer: Medicaid Other | Admitting: Urology

## 2022-04-02 VITALS — BP 106/71 | HR 56 | Ht 63.0 in | Wt 113.0 lb

## 2022-04-02 DIAGNOSIS — N200 Calculus of kidney: Secondary | ICD-10-CM | POA: Diagnosis present

## 2022-04-02 DIAGNOSIS — N201 Calculus of ureter: Secondary | ICD-10-CM | POA: Diagnosis not present

## 2022-04-02 LAB — URINALYSIS, ROUTINE W REFLEX MICROSCOPIC
Bilirubin, UA: NEGATIVE
Glucose, UA: NEGATIVE
Ketones, UA: NEGATIVE
Leukocytes,UA: NEGATIVE
Nitrite, UA: NEGATIVE
Specific Gravity, UA: 1.02 (ref 1.005–1.030)
Urobilinogen, Ur: 0.2 mg/dL (ref 0.2–1.0)
pH, UA: 6 (ref 5.0–7.5)

## 2022-04-02 LAB — MICROSCOPIC EXAMINATION
Epithelial Cells (non renal): >10 /hpf — AB (ref 0–10)
Renal Epithel, UA: NONE SEEN /hpf

## 2022-04-02 MED ORDER — OXYCODONE-ACETAMINOPHEN 5-325 MG PO TABS: 1.0000 | ORAL_TABLET | ORAL | 0 refills | Status: DC | PRN

## 2022-04-02 MED ORDER — TAMSULOSIN HCL 0.4 MG PO CAPS: 0.4000 mg | ORAL_CAPSULE | Freq: Every day | ORAL | 0 refills | Status: DC

## 2022-04-02 NOTE — Progress Notes (Signed)
04/02/2022 2:57 PM   Stacey Bates 2001-06-01 [id number]  Referring provider: Marinda Elk, MD [address] [place],  [place] [address]  nephrolithiasis   HPI: Stacey Bates is a 21yo here for evaluation of nephrolithiasis. She developed left flank pain 6 weeks ago and presented ER 6/25 and was diagnosed with a elft 56mm ureteral calculus. She was discharegd on medical expulsive therapy. She continued to have left flank pain and presented to the ER on 7/20 and had a KUB which showed a 47mm left distal ureteral calculus. She has not passed her calculus. She continue to have intermittent left flank pain. She has urinary frequency, urgency and occasional dysuria. KUb from today shows a 14mm left distal ureteral calculus.    PMH: Past Medical History:  Diagnosis Date   Ankle sprain    Mental disorder    depression    Surgical History: Past Surgical History:  Procedure Laterality Date   TONSILLECTOMY AND ADENOIDECTOMY      Home Medications:  Allergies as of 04/02/2022       Reactions   Cefdinir Hives   Other reaction(s): Hives   Cephalosporins Swelling   Sulfa Antibiotics Rash        Medication List        Accurate as of April 02, 2022  2:57 PM. If you have any questions, ask your nurse or doctor.          doxepin 10 MG capsule Commonly known as: SINEQUAN Take 10 mg by mouth at bedtime.   escitalopram 20 MG tablet Commonly known as: LEXAPRO Take 20 mg by mouth daily.   HYDROcodone-acetaminophen 5-325 MG tablet Commonly known as: NORCO/VICODIN Take 1 tablet by mouth every 6 (six) hours as needed.   lamoTRIgine 25 MG tablet Commonly known as: LAMICTAL Take 50 mg by mouth at bedtime.   Nexplanon 68 MG Impl implant Generic drug: etonogestrel 1 each by Subdermal route once.   ondansetron 4 MG tablet Commonly known as: ZOFRAN Take 1 tablet (4 mg total) by mouth every 8 (eight) hours as needed for nausea or vomiting.   oxyCODONE-acetaminophen  5-325 MG tablet Commonly known as: PERCOCET/ROXICET Take 1 tablet by mouth every 6 (six) hours as needed.   tamsulosin 0.4 MG Caps capsule Commonly known as: FLOMAX Take 1 capsule (0.4 mg total) by mouth daily after supper.   traZODone 100 MG tablet Commonly known as: DESYREL Take 100 mg by mouth at bedtime.        Allergies:  Allergies  Allergen Reactions   Cefdinir Hives    Other reaction(s): Hives   Cephalosporins Swelling   Sulfa Antibiotics Rash    Family History: Family History  Problem Relation Age of Onset   Depression Mother     Social History:  reports that she has quit smoking. Her smoking use included e-cigarettes. She has never used smokeless tobacco. She reports that she does not currently use drugs after having used the following drugs: Marijuana. She reports that she does not drink alcohol.  ROS: All other review of systems were reviewed and are negative except what is noted above in HPI  Physical Exam: BP 106/71   Pulse (!) 56   Ht 5\' 3"  (1.6 m)   Wt 113 lb (51.3 kg)   LMP 03/18/2022 (Exact Date) Comment: Negative u-preg in ED  03/20/22  BMI 20.02 kg/m   Constitutional:  Alert and oriented, No acute distress. HEENT: [place] AT, moist mucus membranes.  Trachea midline, no  masses. Cardiovascular: No clubbing, cyanosis, or edema. Respiratory: Normal respiratory effort, no increased work of breathing. GI: Abdomen is soft, nontender, nondistended, no abdominal masses GU: No CVA tenderness.  Lymph: No cervical or inguinal lymphadenopathy. Skin: No rashes, bruises or suspicious lesions. Neurologic: Grossly intact, no focal deficits, moving all 4 extremities. Psychiatric: Normal mood and affect.  Laboratory Data: Lab Results  Component Value Date   WBC 7.5 03/20/2022   HGB 12.2 03/20/2022   HCT 37.7 03/20/2022   MCV 86.5 03/20/2022   PLT 289 03/20/2022    Lab Results  Component Value Date   CREATININE 0.88 03/20/2022    No results found for:  "PSA"  No results found for: "TESTOSTERONE"  No results found for: "HGBA1C"  Urinalysis    Component Value Date/Time   COLORURINE YELLOW 03/20/2022 0934   APPEARANCEUR CLEAR 03/20/2022 0934   LABSPEC 1.013 03/20/2022 0934   PHURINE 7.0 03/20/2022 0934   GLUCOSEU NEGATIVE 03/20/2022 0934   HGBUR NEGATIVE 03/20/2022 0934   BILIRUBINUR NEGATIVE 03/20/2022 0934   KETONESUR NEGATIVE 03/20/2022 0934   PROTEINUR NEGATIVE 03/20/2022 0934   NITRITE NEGATIVE 03/20/2022 0934   LEUKOCYTESUR SMALL (A) 03/20/2022 0934    Lab Results  Component Value Date   BACTERIA RARE (A) 03/20/2022    Pertinent Imaging: CT 6/25, KUB 7/20 and KUB today: Images reviewed and discussed with the patient  Results for orders placed during the hospital encounter of 03/20/22  DG Abdomen 1 View  Narrative CLINICAL DATA:  Left-sided flank pain  EXAM: ABDOMEN - 1 VIEW  COMPARISON:  02/23/2022  FINDINGS: No bowel dilatation to suggest obstruction. No evidence of pneumoperitoneum, portal venous gas or pneumatosis.  Possible 4 mm distal left ureteral calculus in the left side of the pelvis.  No acute osseous abnormality.  IMPRESSION: Possible 4 mm distal left ureteral calculus in the left side of the pelvis.   Electronically Signed By: Elige Ko M.D. On: 03/20/2022 12:39  No results found for this or any previous visit.  No results found for this or any previous visit.  No results found for this or any previous visit.  Results for orders placed during the hospital encounter of 03/20/22  US Renal  Narrative CLINICAL DATA:  Follow-up left ureteral calculus.  Persistent pain.  EXAM: RENAL / URINARY TRACT ULTRASOUND COMPLETE  COMPARISON:  CT abdomen and pelvis 02/23/2022  FINDINGS: Right Kidney:  Renal measurements: 12.7 x 3.7 x 4.4 cm = volume: 107 mL. Echogenicity within normal limits. A few small echogenic foci measure up to 6 mm in size, however these do not  definitively reflect calculi as there is no substantial shadowing and no corresponding calculi were present on the recent prior CT. No mass or hydronephrosis visualized.  Left Kidney:  Renal measurements: 10.7 x 5.8 x 4.4 cm = volume: 141 mL. Echogenicity within normal limits. A few small echogenic foci measure up to 6 mm in size, however these do not definitively reflect calculi as there is no substantial shadowing and no corresponding calculi were present on the recent prior CT. There is mild fullness of the renal collecting system without frank hydronephrosis. No mass.  Bladder:  Appears normal for degree of bladder distention. Bilateral ureteral jets were visualized.  Other:  None.  IMPRESSION: 1. Mild fullness of the left renal collecting system without frank hydronephrosis. 2. Scattered small echogenic foci in both kidneys which do not definitively reflect calculi.   Electronically Signed By: Sebastian Ache M.D. On: 03/20/2022 10:40  No results found for this or any previous visit.  No results found for this or any previous visit.  No results found for this or any previous visit.   Assessment & Plan:    1. Kidney stones -We discussed the management of kidney stones. These options include observation, ureteroscopy, shockwave lithotripsy (ESWL) and percutaneous nephrolithotomy (PCNL). We discussed which options are relevant to the patient's stone(s). We discussed the natural history of kidney stones as well as the complications of untreated stones and the impact on quality of life without treatment as well as with each of the above listed treatments. We also discussed the efficacy of each treatment in its ability to clear the stone burden. With any of these management options I discussed the signs and symptoms of infection and the need for emergent treatment should these be experienced. For each option we discussed the ability of each procedure to clear the patient of  their stone burden.   For observation I described the risks which include but are not limited to silent renal damage, life-threatening infection, need for emergent surgery, failure to pass stone and pain.   For ureteroscopy I described the risks which include bleeding, infection, damage to contiguous structures, positioning injury, ureteral stricture, ureteral avulsion, ureteral injury, need for prolonged ureteral stent, inability to perform ureteroscopy, need for an interval procedure, inability to clear stone burden, stent discomfort/pain, heart attack, stroke, pulmonary embolus and the inherent risks with general anesthesia.   For shockwave lithotripsy I described the risks which include arrhythmia, kidney contusion, kidney hemorrhage, need for transfusion, pain, inability to adequately break up stone, inability to pass stone fragments, Steinstrasse, infection associated with obstructing stones, need for alternate surgical procedure, need for repeat shockwave lithotripsy, MI, CVA, PE and the inherent risks with anesthesia/conscious sedation.   For PCNL I described the risks including positioning injury, pneumothorax, hydrothorax, need for chest tube, inability to clear stone burden, renal laceration, arterial venous fistula or malformation, need for embolization of kidney, loss of kidney or renal function, need for repeat procedure, need for prolonged nephrostomy tube, ureteral avulsion, MI, CVA, PE and the inherent risks of general anesthesia.   - The patient would like to proceed with medical expulsive therapy. RTC 1 week with a KUB - Urinalysis, Routine w reflex microscopic   No follow-ups on file.  Wilkie Aye, MD  [facility] [place]

## 2022-04-10 ENCOUNTER — Ambulatory Visit: Payer: Medicaid Other | Admitting: Urology

## 2022-04-10 NOTE — Progress Notes (Deleted)
   Assessment: 1. Kidney stones     Plan: ***  Chief Complaint: No chief complaint on file.   HPI: Stacey Bates is a 21 y.o. female who presents for continued evaluation of ***.   Portions of the above documentation were copied from a prior visit for review purposes only.  Allergies: Allergies  Allergen Reactions   Cefdinir Hives    Other reaction(s): Hives   Cephalosporins Swelling   Sulfa Antibiotics Rash    PMH: Past Medical History:  Diagnosis Date   Ankle sprain    Mental disorder    depression    PSH: Past Surgical History:  Procedure Laterality Date   TONSILLECTOMY AND ADENOIDECTOMY      SH: Social History   Tobacco Use   Smoking status: Former    Types: E-cigarettes   Smokeless tobacco: Never  Vaping Use   Vaping Use: Former  Substance Use Topics   Alcohol use: No   Drug use: Not Currently    Types: Marijuana    ROS: Constitutional:  Negative for fever, chills, weight loss CV: Negative for chest pain, previous MI, hypertension Respiratory:  Negative for shortness of breath, wheezing, sleep apnea, frequent cough GI:  Negative for nausea, vomiting, bloody stool, GERD  PE: LMP 03/18/2022 (Exact Date) Comment: Negative u-preg in ED  03/20/22 GENERAL APPEARANCE:  Well appearing, well developed, well nourished, NAD HEENT:  Atraumatic, normocephalic, oropharynx clear NECK:  Supple without lymphadenopathy or thyromegaly ABDOMEN:  Soft, non-tender, no masses EXTREMITIES:  Moves all extremities well, without clubbing, cyanosis, or edema NEUROLOGIC:  Alert and oriented x 3, normal gait, CN II-XII grossly intact MENTAL STATUS:  appropriate BACK:  Non-tender to palpation, No CVAT SKIN:  Warm, dry, and intact   Results: U/A:

## 2022-04-12 ENCOUNTER — Encounter: Payer: Self-pay | Admitting: Urology

## 2022-04-12 NOTE — Patient Instructions (Signed)

## 2022-05-07 DIAGNOSIS — B9789 Other viral agents as the cause of diseases classified elsewhere: Secondary | ICD-10-CM | POA: Diagnosis not present

## 2022-05-07 DIAGNOSIS — J028 Acute pharyngitis due to other specified organisms: Secondary | ICD-10-CM | POA: Diagnosis not present

## 2022-06-07 ENCOUNTER — Emergency Department (HOSPITAL_COMMUNITY)
Admission: EM | Admit: 2022-06-07 | Discharge: 2022-06-08 | Disposition: A | Discharge status: Home / Self Care | Payer: Medicaid Other | Attending: Emergency Medicine | Admitting: Emergency Medicine

## 2022-06-07 ENCOUNTER — Encounter (HOSPITAL_COMMUNITY): Payer: Self-pay | Admitting: Emergency Medicine

## 2022-06-07 DIAGNOSIS — N3001 Acute cystitis with hematuria: Secondary | ICD-10-CM | POA: Diagnosis not present

## 2022-06-07 DIAGNOSIS — R319 Hematuria, unspecified: Secondary | ICD-10-CM | POA: Diagnosis present

## 2022-06-07 NOTE — ED Triage Notes (Signed)
Pt c/o hematuria for the past 3 days that has increased. Pt also c/o abd cramping for the past few days. States she hasn't had a period in several months and has taken several pregnancy tests that were negative. Hx of kidney stones.

## 2022-06-07 NOTE — ED Provider Notes (Signed)
[facility] EMERGENCY DEPARTMENT Provider Note   CSN: [id number] Arrival date & time: 06/07/22  2325     History {Add pertinent medical, surgical, social history, OB history to HPI:1} Chief Complaint  Patient presents with   Hematuria    Stacey Bates is a 21 y.o. female.  Patient is a 21 year old female with with no significant past medical history.  Patient presenting today with complaints of hematuria.  For the past 3 days she has noticed red-colored urine along with some abdominal cramping.  She denies dysuria, fevers, or chills.  She did have a kidney stone several months ago, however believes this passed and had no follow-up.  She reports taking pregnancy test at home that are negative.  She denies any vaginal bleeding or discharge.  She reports not having had a.  For the past 3 months, but believes this is related to her Nexplanon implant.  The history is provided by the patient.       Home Medications Prior to Admission medications   Medication Sig Start Date End Date Taking? Authorizing Provider  doxepin (SINEQUAN) 10 MG capsule Take 10 mg by mouth at bedtime. 08/03/19   [provider]  escitalopram (LEXAPRO) 20 MG tablet Take 20 mg by mouth daily. 08/03/19   [provider]  etonogestrel (NEXPLANON) 68 MG IMPL implant 1 each by Subdermal route once.    [provider]  HYDROcodone-acetaminophen (NORCO/VICODIN) 5-325 MG tablet Take 1 tablet by mouth every 6 (six) hours as needed. 02/25/22   Noemi Chapel, MD  lamoTRIgine (LAMICTAL) 25 MG tablet Take 50 mg by mouth at bedtime. 08/31/19   [provider]  ondansetron (ZOFRAN) 4 MG tablet Take 1 tablet (4 mg total) by mouth every 8 (eight) hours as needed for nausea or vomiting. 02/23/22   Azucena Cecil, PA-C  oxyCODONE-acetaminophen (PERCOCET/ROXICET) 5-325 MG tablet Take 1 tablet by mouth every 4 (four) hours as needed. 04/02/22   McKenzie, Candee Furbish, MD  tamsulosin (FLOMAX) 0.4 MG  CAPS capsule Take 1 capsule (0.4 mg total) by mouth daily after supper. 04/02/22   McKenzie, Candee Furbish, MD  traZODone (DESYREL) 100 MG tablet Take 100 mg by mouth at bedtime. 08/31/19   [provider]      Allergies    Cefdinir, Cephalosporins, and Sulfa antibiotics    Review of Systems   Review of Systems  All other systems reviewed and are negative.   Physical Exam Updated Vital Signs BP 130/75   Pulse (!) 59   Temp 97.9 F (36.6 C) (Oral)   Resp 17   Ht 5\' 3"  (1.6 m)   Wt 51.3 kg   SpO2 100%   BMI 20.03 kg/m  Physical Exam Vitals and nursing note reviewed.  Constitutional:      General: She is not in acute distress.    Appearance: She is well-developed. She is not diaphoretic.  HENT:     Head: Normocephalic and atraumatic.  Cardiovascular:     Rate and Rhythm: Normal rate and regular rhythm.     Heart sounds: No murmur heard.    No friction rub. No gallop.  Pulmonary:     Effort: Pulmonary effort is normal. No respiratory distress.     Breath sounds: Normal breath sounds. No wheezing.  Abdominal:     General: Bowel sounds are normal. There is no distension.     Palpations: Abdomen is soft.     Tenderness: There is abdominal tenderness. There is no right CVA  tenderness, left CVA tenderness, guarding or rebound.     Comments: There is mild tenderness in the periumbilical and epigastric region.  Musculoskeletal:        General: Normal range of motion.     Cervical back: Normal range of motion and neck supple.  Skin:    General: Skin is warm and dry.  Neurological:     General: No focal deficit present.     Mental Status: She is alert and oriented to person, place, and time.     ED Results / Procedures / Treatments   Labs (all labs ordered are listed, but only abnormal results are displayed) Labs Reviewed - No data to display  EKG None  Radiology No results found.  Procedures Procedures  {Document cardiac monitor, telemetry assessment  procedure when appropriate:1}  Medications Ordered in ED Medications - No data to display  ED Course/ Medical Decision Making/ A&P                           Medical Decision Making Amount and/or Complexity of Data Reviewed Labs: ordered.   ***  {Document critical care time when appropriate:1} {Document review of labs and clinical decision tools ie heart score, Chads2Vasc2 etc:1}  {Document your independent review of radiology images, and any outside records:1} {Document your discussion with family members, caretakers, and with consultants:1} {Document social determinants of health affecting pt's care:1} {Document your decision making why or why not admission, treatments were needed:1} Final Clinical Impression(s) / ED Diagnoses Final diagnoses:  None    Rx / DC Orders ED Discharge Orders     None

## 2022-06-08 LAB — COMPREHENSIVE METABOLIC PANEL
ALT: 26 U/L (ref 0–44)
AST: 23 U/L (ref 15–41)
Albumin: 4.3 g/dL (ref 3.5–5.0)
Alkaline Phosphatase: 58 U/L (ref 38–126)
Anion gap: 7 (ref 5–15)
BUN: 9 mg/dL (ref 6–20)
CO2: 25 mmol/L (ref 22–32)
Calcium: 9 mg/dL (ref 8.9–10.3)
Chloride: 107 mmol/L (ref 98–111)
Creatinine, Ser: 0.83 mg/dL (ref 0.44–1.00)
GFR, Estimated: >60 mL/min (ref >60)
Glucose, Bld: 86 mg/dL (ref 70–99)
Potassium: 3.7 mmol/L (ref 3.5–5.1)
Sodium: 139 mmol/L (ref 135–145)
Total Bilirubin: 1.1 mg/dL (ref 0.3–1.2)
Total Protein: 7.4 g/dL (ref 6.5–8.1)

## 2022-06-08 LAB — URINALYSIS, ROUTINE W REFLEX MICROSCOPIC
Bacteria, UA: NONE SEEN
Bilirubin Urine: NEGATIVE
Glucose, UA: NEGATIVE mg/dL
Ketones, ur: NEGATIVE mg/dL
Leukocytes,Ua: NEGATIVE
Nitrite: NEGATIVE
Protein, ur: 100 mg/dL — AB
RBC / HPF: >50 RBC/hpf — ABNORMAL HIGH (ref 0–5)
Specific Gravity, Urine: 1.017 (ref 1.005–1.030)
WBC, UA: >50 WBC/hpf — ABNORMAL HIGH (ref 0–5)
pH: 7 (ref 5.0–8.0)

## 2022-06-08 LAB — CBC WITH DIFFERENTIAL/PLATELET
Abs Immature Granulocytes: 0.01 10*3/uL (ref 0.00–0.07)
Basophils Absolute: 0.1 10*3/uL (ref 0.0–0.1)
Basophils Relative: 1 %
Eosinophils Absolute: 0.4 10*3/uL (ref 0.0–0.5)
Eosinophils Relative: 5 %
HCT: 37.9 % (ref 36.0–46.0)
Hemoglobin: 12.3 g/dL (ref 12.0–15.0)
Immature Granulocytes: 0 %
Lymphocytes Relative: 39 %
Lymphs Abs: 2.7 10*3/uL (ref 0.7–4.0)
MCH: 27.8 pg (ref 26.0–34.0)
MCHC: 32.5 g/dL (ref 30.0–36.0)
MCV: 85.6 fL (ref 80.0–100.0)
Monocytes Absolute: 0.6 10*3/uL (ref 0.1–1.0)
Monocytes Relative: 8 %
Neutro Abs: 3.3 10*3/uL (ref 1.7–7.7)
Neutrophils Relative %: 47 %
Platelets: 278 10*3/uL (ref 150–400)
RBC: 4.43 MIL/uL (ref 3.87–5.11)
RDW: 13.2 % (ref 11.5–15.5)
WBC: 7 10*3/uL (ref 4.0–10.5)
nRBC: 0 % (ref 0.0–0.2)

## 2022-06-08 LAB — LIPASE, BLOOD: Lipase: 34 U/L (ref 11–51)

## 2022-06-08 LAB — PREGNANCY, URINE: Preg Test, Ur: NEGATIVE

## 2022-06-08 MED ORDER — CIPROFLOXACIN HCL 250 MG PO TABS
500.0000 mg | ORAL_TABLET | Freq: Once | ORAL | Status: AC
  Administered 2022-06-08: 500 mg via ORAL
  Filled 2022-06-08: qty 2

## 2022-06-08 MED ORDER — CIPROFLOXACIN HCL 500 MG PO TABS: 500.0000 mg | ORAL_TABLET | Freq: Two times a day (BID) | ORAL | 0 refills | Status: DC

## 2022-06-08 NOTE — Discharge Instructions (Addendum)
Begin taking Cipro as prescribed.  Follow-up with primary doctor if not improving in the next few days, and return to the ER if symptoms significantly worsen or change.

## 2022-09-24 ENCOUNTER — Encounter: Payer: Self-pay | Admitting: Advanced Practice Midwife

## 2022-09-24 ENCOUNTER — Ambulatory Visit: Payer: BC Managed Care – PPO | Admitting: Advanced Practice Midwife

## 2022-09-24 VITALS — BP 118/70 | HR 79 | Ht 63.0 in | Wt 113.0 lb

## 2022-09-24 DIAGNOSIS — A599 Trichomoniasis, unspecified: Secondary | ICD-10-CM

## 2022-09-24 DIAGNOSIS — A749 Chlamydial infection, unspecified: Secondary | ICD-10-CM

## 2022-09-24 DIAGNOSIS — Z3046 Encounter for surveillance of implantable subdermal contraceptive: Secondary | ICD-10-CM | POA: Diagnosis not present

## 2022-09-24 DIAGNOSIS — Z30017 Encounter for initial prescription of implantable subdermal contraceptive: Secondary | ICD-10-CM

## 2022-09-24 MED ORDER — NEXTSTELLIS 3-14.2 MG PO TABS: 1.0000 | ORAL_TABLET | Freq: Every day | ORAL | 11 refills | Status: DC

## 2022-09-24 NOTE — Progress Notes (Signed)
   [facility] REMOVAL Patient name: Stacey Bates MRN [id number]  Date of birth: 2000-11-12 Subjective Findings:   Stacey Bates is a 22 y.o. G0P0000 Caucasian female being seen today for removal of a Nexplanon. Her Nexplanon was placed Dec 2020.  She desires removal because of irreg and heavy bleeding and weight loss. Signed copy of informed consent in chart.   No LMP recorded. Patient has had an implant. Last pap<21yo. Results were: N/A The planned method of family planning is changing from Nex to OCPs      No data to display               No data to display           Pertinent History Reviewed:   Reviewed past medical,surgical, social, obstetrical and family history.  Reviewed problem list, medications and allergies. Objective Findings & Procedure:    Vitals:   09/24/22 1335  BP: 118/70  Pulse: 79  Weight: 113 lb (51.3 kg)  Height: 5\' 3"  (1.6 m)  Body mass index is 20.02 kg/m.  No results found for this or any previous visit (from the past 24 hour(s)).   Time out was performed.  Nexplanon site identified.  Area prepped in usual sterile fashon. Two cc of 2% lidocaine was used to anesthetize the area at the distal end of the implant. A small stab incision was made right beside the implant on the distal portion.  The Nexplanon rod was grasped using hemostats and removed without difficulty.  There was less than 3 cc blood loss. There were no complications.  Steri-strips were applied over the small incision and a pressure bandage was applied.  The patient tolerated the procedure well. Assessment & Plan:   1) Nexplanon removal She was instructed to keep the area clean and dry, remove pressure bandage in 24 hours, and keep insertion site covered with the steri-strip for 3-5 days.    2) Desires OCPs, samples #3 of Nextstellis given with rx sent for refills x 62yr; start ASAP and use backup x first month   No orders of the defined types were placed in this  encounter.   Follow-up: Return in about 3 months (around 12/24/2022) for contraception f/u.  Myrtis Ser CNM 09/24/2022 4:46 PM

## 2022-10-03 DIAGNOSIS — K219 Gastro-esophageal reflux disease without esophagitis: Secondary | ICD-10-CM | POA: Diagnosis not present

## 2022-10-16 ENCOUNTER — Emergency Department (HOSPITAL_COMMUNITY): Payer: BC Managed Care – PPO

## 2022-10-16 ENCOUNTER — Other Ambulatory Visit: Payer: Self-pay

## 2022-10-16 ENCOUNTER — Encounter (HOSPITAL_COMMUNITY): Payer: Self-pay

## 2022-10-16 ENCOUNTER — Emergency Department (HOSPITAL_COMMUNITY)
Admission: EM | Admit: 2022-10-16 | Discharge: 2022-10-16 | Disposition: A | Discharge status: Home / Self Care | Payer: BC Managed Care – PPO | Attending: Emergency Medicine | Admitting: Emergency Medicine

## 2022-10-16 DIAGNOSIS — S0592XA Unspecified injury of left eye and orbit, initial encounter: Secondary | ICD-10-CM | POA: Diagnosis not present

## 2022-10-16 DIAGNOSIS — S0232XA Fracture of orbital floor, left side, initial encounter for closed fracture: Secondary | ICD-10-CM | POA: Diagnosis not present

## 2022-10-16 DIAGNOSIS — S02832A Fracture of medial orbital wall, left side, initial encounter for closed fracture: Secondary | ICD-10-CM | POA: Diagnosis not present

## 2022-10-16 DIAGNOSIS — S0240DA Maxillary fracture, left side, initial encounter for closed fracture: Secondary | ICD-10-CM | POA: Diagnosis not present

## 2022-10-16 DIAGNOSIS — S0285XA Fracture of orbit, unspecified, initial encounter for closed fracture: Secondary | ICD-10-CM

## 2022-10-16 NOTE — ED Notes (Signed)
Patient transported to CT 
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
## 2022-10-16 NOTE — Discharge Instructions (Addendum)
Evaluation today revealed that you do have an orbital wall fracture around your left eye.  This is the bone that houses your eye.  I talked to Dr. Lucianne Lei who is an eye doctor who stated that your double vision and blurry vision are is likely due to localized swelling in that area and should get better with time.  Nevertheless she did recommend that you follow-up with her in her office tomorrow.  I provided her contact information in your discharge summary.
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
## 2022-10-17 DIAGNOSIS — H532 Diplopia: Secondary | ICD-10-CM | POA: Diagnosis not present

## 2022-10-17 DIAGNOSIS — S0012XA Contusion of left eyelid and periocular area, initial encounter: Secondary | ICD-10-CM | POA: Diagnosis not present

## 2022-10-17 DIAGNOSIS — H1131 Conjunctival hemorrhage, right eye: Secondary | ICD-10-CM | POA: Diagnosis not present

## 2022-10-17 DIAGNOSIS — S0230XA Fracture of orbital floor, unspecified side, initial encounter for closed fracture: Secondary | ICD-10-CM | POA: Diagnosis not present

## 2022-10-30 DIAGNOSIS — H532 Diplopia: Secondary | ICD-10-CM | POA: Diagnosis not present

## 2022-10-30 DIAGNOSIS — H1131 Conjunctival hemorrhage, right eye: Secondary | ICD-10-CM | POA: Diagnosis not present

## 2022-10-30 DIAGNOSIS — S0012XA Contusion of left eyelid and periocular area, initial encounter: Secondary | ICD-10-CM | POA: Diagnosis not present

## 2022-10-30 DIAGNOSIS — S0230XA Fracture of orbital floor, unspecified side, initial encounter for closed fracture: Secondary | ICD-10-CM | POA: Diagnosis not present

## 2022-11-28 ENCOUNTER — Emergency Department (HOSPITAL_COMMUNITY): Payer: BC Managed Care – PPO

## 2022-11-28 ENCOUNTER — Inpatient Hospital Stay (HOSPITAL_COMMUNITY): Payer: No Typology Code available for payment source

## 2022-11-28 ENCOUNTER — Other Ambulatory Visit: Payer: Self-pay

## 2022-11-28 ENCOUNTER — Inpatient Hospital Stay (HOSPITAL_COMMUNITY): Payer: BC Managed Care – PPO | Admitting: Certified Registered Nurse Anesthetist

## 2022-11-28 ENCOUNTER — Inpatient Hospital Stay (HOSPITAL_COMMUNITY)
Admission: EM | Admit: 2022-11-28 | Discharge: 2022-12-05 | DRG: 958 | Disposition: A | Discharge status: Home Health | Payer: BC Managed Care – PPO | Attending: General Surgery | Admitting: General Surgery

## 2022-11-28 ENCOUNTER — Encounter (HOSPITAL_COMMUNITY): Admission: EM | Disposition: A | Discharge status: Home Health | Payer: Self-pay | Source: Home / Self Care

## 2022-11-28 ENCOUNTER — Encounter (HOSPITAL_COMMUNITY): Payer: Self-pay

## 2022-11-28 DIAGNOSIS — R310 Gross hematuria: Secondary | ICD-10-CM | POA: Diagnosis not present

## 2022-11-28 DIAGNOSIS — S199XXA Unspecified injury of neck, initial encounter: Secondary | ICD-10-CM | POA: Diagnosis not present

## 2022-11-28 DIAGNOSIS — R58 Hemorrhage, not elsewhere classified: Secondary | ICD-10-CM | POA: Diagnosis not present

## 2022-11-28 DIAGNOSIS — S0081XA Abrasion of other part of head, initial encounter: Secondary | ICD-10-CM | POA: Diagnosis not present

## 2022-11-28 DIAGNOSIS — M79671 Pain in right foot: Secondary | ICD-10-CM | POA: Diagnosis not present

## 2022-11-28 DIAGNOSIS — M25551 Pain in right hip: Secondary | ICD-10-CM | POA: Diagnosis not present

## 2022-11-28 DIAGNOSIS — R202 Paresthesia of skin: Secondary | ICD-10-CM | POA: Diagnosis not present

## 2022-11-28 DIAGNOSIS — S80812A Abrasion, left lower leg, initial encounter: Secondary | ICD-10-CM | POA: Diagnosis not present

## 2022-11-28 DIAGNOSIS — F32A Depression, unspecified: Secondary | ICD-10-CM | POA: Diagnosis present

## 2022-11-28 DIAGNOSIS — S70312A Abrasion, left thigh, initial encounter: Secondary | ICD-10-CM | POA: Diagnosis present

## 2022-11-28 DIAGNOSIS — S27322A Contusion of lung, bilateral, initial encounter: Secondary | ICD-10-CM | POA: Diagnosis present

## 2022-11-28 DIAGNOSIS — S0990XA Unspecified injury of head, initial encounter: Secondary | ICD-10-CM | POA: Diagnosis not present

## 2022-11-28 DIAGNOSIS — S90512A Abrasion, left ankle, initial encounter: Secondary | ICD-10-CM | POA: Diagnosis not present

## 2022-11-28 DIAGNOSIS — S32059A Unspecified fracture of fifth lumbar vertebra, initial encounter for closed fracture: Secondary | ICD-10-CM | POA: Diagnosis not present

## 2022-11-28 DIAGNOSIS — S32811A Multiple fractures of pelvis with unstable disruption of pelvic ring, initial encounter for closed fracture: Secondary | ICD-10-CM | POA: Diagnosis not present

## 2022-11-28 DIAGNOSIS — T07XXXA Unspecified multiple injuries, initial encounter: Secondary | ICD-10-CM | POA: Diagnosis not present

## 2022-11-28 DIAGNOSIS — Z435 Encounter for attention to cystostomy: Secondary | ICD-10-CM | POA: Diagnosis not present

## 2022-11-28 DIAGNOSIS — Z818 Family history of other mental and behavioral disorders: Secondary | ICD-10-CM

## 2022-11-28 DIAGNOSIS — Y92410 Unspecified street and highway as the place of occurrence of the external cause: Secondary | ICD-10-CM | POA: Diagnosis not present

## 2022-11-28 DIAGNOSIS — M25571 Pain in right ankle and joints of right foot: Secondary | ICD-10-CM | POA: Diagnosis not present

## 2022-11-28 DIAGNOSIS — S32058A Other fracture of fifth lumbar vertebra, initial encounter for closed fracture: Secondary | ICD-10-CM | POA: Diagnosis not present

## 2022-11-28 DIAGNOSIS — S3219XA Other fracture of sacrum, initial encounter for closed fracture: Secondary | ICD-10-CM | POA: Diagnosis not present

## 2022-11-28 DIAGNOSIS — R1084 Generalized abdominal pain: Secondary | ICD-10-CM | POA: Diagnosis not present

## 2022-11-28 DIAGNOSIS — D62 Acute posthemorrhagic anemia: Secondary | ICD-10-CM | POA: Diagnosis not present

## 2022-11-28 DIAGNOSIS — Z881 Allergy status to other antibiotic agents status: Secondary | ICD-10-CM

## 2022-11-28 DIAGNOSIS — S90511A Abrasion, right ankle, initial encounter: Secondary | ICD-10-CM | POA: Diagnosis not present

## 2022-11-28 DIAGNOSIS — F17290 Nicotine dependence, other tobacco product, uncomplicated: Secondary | ICD-10-CM | POA: Diagnosis not present

## 2022-11-28 DIAGNOSIS — S3289XA Fracture of other parts of pelvis, initial encounter for closed fracture: Secondary | ICD-10-CM | POA: Diagnosis not present

## 2022-11-28 DIAGNOSIS — Z882 Allergy status to sulfonamides status: Secondary | ICD-10-CM

## 2022-11-28 DIAGNOSIS — S3282XA Multiple fractures of pelvis without disruption of pelvic ring, initial encounter for closed fracture: Secondary | ICD-10-CM | POA: Diagnosis not present

## 2022-11-28 DIAGNOSIS — M79675 Pain in left toe(s): Secondary | ICD-10-CM | POA: Diagnosis present

## 2022-11-28 DIAGNOSIS — R0902 Hypoxemia: Secondary | ICD-10-CM | POA: Diagnosis not present

## 2022-11-28 DIAGNOSIS — S3993XA Unspecified injury of pelvis, initial encounter: Secondary | ICD-10-CM | POA: Diagnosis not present

## 2022-11-28 DIAGNOSIS — Z041 Encounter for examination and observation following transport accident: Secondary | ICD-10-CM | POA: Diagnosis not present

## 2022-11-28 DIAGNOSIS — S32019A Unspecified fracture of first lumbar vertebra, initial encounter for closed fracture: Secondary | ICD-10-CM | POA: Diagnosis not present

## 2022-11-28 DIAGNOSIS — S32009A Unspecified fracture of unspecified lumbar vertebra, initial encounter for closed fracture: Secondary | ICD-10-CM

## 2022-11-28 DIAGNOSIS — M549 Dorsalgia, unspecified: Secondary | ICD-10-CM | POA: Diagnosis not present

## 2022-11-28 HISTORY — PX: CYSTOGRAM: SHX6285

## 2022-11-28 HISTORY — PX: ORIF PELVIC FRACTURE: SHX2128

## 2022-11-28 HISTORY — PX: INSERTION OF TRACTION PIN: SHX6560

## 2022-11-28 LAB — CBC
HCT: 38.5 % (ref 36.0–46.0)
Hemoglobin: 12.5 g/dL (ref 12.0–15.0)
MCH: 28.8 pg (ref 26.0–34.0)
MCHC: 32.5 g/dL (ref 30.0–36.0)
MCV: 88.7 fL (ref 80.0–100.0)
Platelets: 249 10*3/uL (ref 150–400)
RBC: 4.34 MIL/uL (ref 3.87–5.11)
RDW: 12.6 % (ref 11.5–15.5)
WBC: 11.8 10*3/uL — ABNORMAL HIGH (ref 4.0–10.5)
nRBC: 0 % (ref 0.0–0.2)

## 2022-11-28 LAB — I-STAT CHEM 8, ED
BUN: 9 mg/dL (ref 6–20)
Calcium, Ion: 1.11 mmol/L — ABNORMAL LOW (ref 1.15–1.40)
Chloride: 103 mmol/L (ref 98–111)
Creatinine, Ser: 1 mg/dL (ref 0.44–1.00)
Glucose, Bld: 114 mg/dL — ABNORMAL HIGH (ref 70–99)
HCT: 37 % (ref 36.0–46.0)
Hemoglobin: 12.6 g/dL (ref 12.0–15.0)
Potassium: 5.1 mmol/L (ref 3.5–5.1)
Sodium: 138 mmol/L (ref 135–145)
TCO2: 28 mmol/L (ref 22–32)

## 2022-11-28 LAB — ETHANOL: Alcohol, Ethyl (B): <10 mg/dL (ref <10)

## 2022-11-28 LAB — SAMPLE TO BLOOD BANK

## 2022-11-28 LAB — COMPREHENSIVE METABOLIC PANEL
ALT: 52 U/L — ABNORMAL HIGH (ref 0–44)
AST: 85 U/L — ABNORMAL HIGH (ref 15–41)
Albumin: 4.1 g/dL (ref 3.5–5.0)
Alkaline Phosphatase: 67 U/L (ref 38–126)
Anion gap: 9 (ref 5–15)
BUN: 8 mg/dL (ref 6–20)
CO2: 26 mmol/L (ref 22–32)
Calcium: 8.6 mg/dL — ABNORMAL LOW (ref 8.9–10.3)
Chloride: 104 mmol/L (ref 98–111)
Creatinine, Ser: 1.03 mg/dL — ABNORMAL HIGH (ref 0.44–1.00)
GFR, Estimated: >60 mL/min (ref >60)
Glucose, Bld: 124 mg/dL — ABNORMAL HIGH (ref 70–99)
Potassium: 3.7 mmol/L (ref 3.5–5.1)
Sodium: 139 mmol/L (ref 135–145)
Total Bilirubin: 0.7 mg/dL (ref 0.3–1.2)
Total Protein: 6.8 g/dL (ref 6.5–8.1)

## 2022-11-28 LAB — PROTIME-INR
INR: 1.2 (ref 0.8–1.2)
Prothrombin Time: 15.1 seconds (ref 11.4–15.2)

## 2022-11-28 LAB — LACTIC ACID, PLASMA: Lactic Acid, Venous: 1.9 mmol/L (ref 0.5–1.9)

## 2022-11-28 LAB — HIV ANTIBODY (ROUTINE TESTING W REFLEX): HIV Screen 4th Generation wRfx: NONREACTIVE

## 2022-11-28 LAB — I-STAT BETA HCG BLOOD, ED (MC, WL, AP ONLY): I-stat hCG, quantitative: <5 m[IU]/mL (ref <5)

## 2022-11-28 SURGERY — OPEN REDUCTION INTERNAL FIXATION (ORIF) PELVIC FRACTURE
Anesthesia: General | Site: Leg Lower | Laterality: Right

## 2022-11-28 MED ORDER — VANCOMYCIN HCL IN DEXTROSE 1-5 GM/200ML-% IV SOLN
1000.0000 mg | Freq: Two times a day (BID) | INTRAVENOUS | Status: AC
  Administered 2022-11-28: 1000 mg via INTRAVENOUS
  Filled 2022-11-28: qty 200

## 2022-11-28 MED ORDER — SODIUM CHLORIDE 0.9 % IV SOLN: INTRAVENOUS | Status: DC | PRN

## 2022-11-28 MED ORDER — ONDANSETRON HCL 4 MG/2ML IJ SOLN
4.0000 mg | Freq: Four times a day (QID) | INTRAMUSCULAR | Status: DC | PRN
  Administered 2022-11-28 – 2022-12-03 (×3): 4 mg via INTRAVENOUS
  Filled 2022-11-28 (×3): qty 2

## 2022-11-28 MED ORDER — METOCLOPRAMIDE HCL 5 MG PO TABS: 5.0000 mg | ORAL_TABLET | Freq: Three times a day (TID) | ORAL | Status: DC | PRN

## 2022-11-28 MED ORDER — METOCLOPRAMIDE HCL 5 MG/ML IJ SOLN: 5.0000 mg | Freq: Three times a day (TID) | INTRAMUSCULAR | Status: DC | PRN

## 2022-11-28 MED ORDER — DOCUSATE SODIUM 100 MG PO CAPS
100.0000 mg | ORAL_CAPSULE | Freq: Two times a day (BID) | ORAL | Status: DC
  Administered 2022-11-28 – 2022-12-05 (×12): 100 mg via ORAL
  Filled 2022-11-28 (×14): qty 1

## 2022-11-28 MED ORDER — METHOCARBAMOL 500 MG PO TABS
500.0000 mg | ORAL_TABLET | Freq: Three times a day (TID) | ORAL | Status: DC | PRN
  Administered 2022-11-28: 500 mg via ORAL
  Filled 2022-11-28: qty 1

## 2022-11-28 MED ORDER — EPHEDRINE 5 MG/ML INJ
INTRAVENOUS | Status: AC
  Filled 2022-11-28: qty 5

## 2022-11-28 MED ORDER — ALBUMIN HUMAN 5 % IV SOLN: INTRAVENOUS | Status: DC | PRN

## 2022-11-28 MED ORDER — OXYCODONE HCL 5 MG PO TABS
5.0000 mg | ORAL_TABLET | ORAL | Status: DC | PRN
  Filled 2022-11-28: qty 2

## 2022-11-28 MED ORDER — VANCOMYCIN HCL 1000 MG IV SOLR
INTRAVENOUS | Status: DC | PRN
  Administered 2022-11-28: 1000 mg via TOPICAL

## 2022-11-28 MED ORDER — HYDROMORPHONE HCL 1 MG/ML IJ SOLN: 0.2500 mg | INTRAMUSCULAR | Status: DC | PRN

## 2022-11-28 MED ORDER — ACETAMINOPHEN 500 MG PO TABS: 1000.0000 mg | ORAL_TABLET | Freq: Three times a day (TID) | ORAL | Status: DC

## 2022-11-28 MED ORDER — HYDROMORPHONE HCL 1 MG/ML IJ SOLN
0.5000 mg | INTRAMUSCULAR | Status: DC | PRN
  Administered 2022-11-28: 0.5 mg via INTRAVENOUS
  Administered 2022-11-28 – 2022-12-02 (×4): 1 mg via INTRAVENOUS
  Filled 2022-11-28 (×5): qty 1

## 2022-11-28 MED ORDER — PROMETHAZINE HCL 25 MG/ML IJ SOLN: 6.2500 mg | INTRAMUSCULAR | Status: DC | PRN

## 2022-11-28 MED ORDER — OXYCODONE HCL 5 MG PO TABS
10.0000 mg | ORAL_TABLET | ORAL | Status: DC | PRN
  Administered 2022-11-29 (×2): 10 mg via ORAL
  Filled 2022-11-28: qty 2

## 2022-11-28 MED ORDER — HYDROMORPHONE HCL 1 MG/ML IJ SOLN
0.5000 mg | INTRAMUSCULAR | Status: DC | PRN
  Administered 2022-11-28 (×2): 0.5 mg via INTRAVENOUS
  Filled 2022-11-28 (×2): qty 1

## 2022-11-28 MED ORDER — DEXAMETHASONE SODIUM PHOSPHATE 10 MG/ML IJ SOLN
INTRAMUSCULAR | Status: DC | PRN
  Administered 2022-11-28: 10 mg via INTRAVENOUS

## 2022-11-28 MED ORDER — MIDAZOLAM HCL 2 MG/2ML IJ SOLN
INTRAMUSCULAR | Status: DC | PRN
  Administered 2022-11-28 (×2): 1 mg via INTRAVENOUS

## 2022-11-28 MED ORDER — CHLORHEXIDINE GLUCONATE 0.12 % MT SOLN: 15.0000 mL | Freq: Once | OROMUCOSAL | Status: AC

## 2022-11-28 MED ORDER — ROCURONIUM BROMIDE 10 MG/ML (PF) SYRINGE
PREFILLED_SYRINGE | INTRAVENOUS | Status: DC | PRN
  Administered 2022-11-28: 20 mg via INTRAVENOUS
  Administered 2022-11-28: 10 mg via INTRAVENOUS
  Administered 2022-11-28: 60 mg via INTRAVENOUS

## 2022-11-28 MED ORDER — ACETAMINOPHEN 10 MG/ML IV SOLN
INTRAVENOUS | Status: AC
  Filled 2022-11-28: qty 100

## 2022-11-28 MED ORDER — ACETAMINOPHEN 500 MG PO TABS
1000.0000 mg | ORAL_TABLET | Freq: Four times a day (QID) | ORAL | Status: DC
  Administered 2022-11-28 – 2022-12-05 (×24): 1000 mg via ORAL
  Filled 2022-11-28 (×27): qty 2

## 2022-11-28 MED ORDER — PHENYLEPHRINE 80 MCG/ML (10ML) SYRINGE FOR IV PUSH (FOR BLOOD PRESSURE SUPPORT)
PREFILLED_SYRINGE | INTRAVENOUS | Status: DC | PRN
  Administered 2022-11-28: 160 ug via INTRAVENOUS
  Administered 2022-11-28 (×2): 240 ug via INTRAVENOUS
  Administered 2022-11-28: 160 ug via INTRAVENOUS

## 2022-11-28 MED ORDER — IOHEXOL 350 MG/ML SOLN
75.0000 mL | Freq: Once | INTRAVENOUS | Status: AC | PRN
  Administered 2022-11-28: 75 mL via INTRAVENOUS

## 2022-11-28 MED ORDER — PHENYLEPHRINE 80 MCG/ML (10ML) SYRINGE FOR IV PUSH (FOR BLOOD PRESSURE SUPPORT)
PREFILLED_SYRINGE | INTRAVENOUS | Status: AC
  Filled 2022-11-28: qty 10

## 2022-11-28 MED ORDER — LIDOCAINE 2% (20 MG/ML) 5 ML SYRINGE
INTRAMUSCULAR | Status: AC
  Filled 2022-11-28: qty 5

## 2022-11-28 MED ORDER — OXYCODONE HCL 5 MG PO TABS: 5.0000 mg | ORAL_TABLET | ORAL | Status: DC | PRN

## 2022-11-28 MED ORDER — ORAL CARE MOUTH RINSE
15.0000 mL | Freq: Once | OROMUCOSAL | Status: AC
  Administered 2022-11-28: 15 mL via OROMUCOSAL

## 2022-11-28 MED ORDER — VANCOMYCIN HCL IN DEXTROSE 1-5 GM/200ML-% IV SOLN
1000.0000 mg | Freq: Once | INTRAVENOUS | Status: AC
  Administered 2022-11-28: 1000 mg via INTRAVENOUS
  Filled 2022-11-28: qty 200

## 2022-11-28 MED ORDER — FENTANYL CITRATE (PF) 250 MCG/5ML IJ SOLN
INTRAMUSCULAR | Status: DC | PRN
  Administered 2022-11-28: 50 ug via INTRAVENOUS
  Administered 2022-11-28: 150 ug via INTRAVENOUS
  Administered 2022-11-28: 50 ug via INTRAVENOUS

## 2022-11-28 MED ORDER — SUGAMMADEX SODIUM 200 MG/2ML IV SOLN
INTRAVENOUS | Status: DC | PRN
  Administered 2022-11-28: 100 mg via INTRAVENOUS

## 2022-11-28 MED ORDER — ACETAMINOPHEN 10 MG/ML IV SOLN
INTRAVENOUS | Status: DC | PRN
  Administered 2022-11-28: 1000 mg via INTRAVENOUS

## 2022-11-28 MED ORDER — ONDANSETRON HCL 4 MG/2ML IJ SOLN
INTRAMUSCULAR | Status: DC | PRN
  Administered 2022-11-28: 4 mg via INTRAVENOUS

## 2022-11-28 MED ORDER — FENTANYL CITRATE (PF) 250 MCG/5ML IJ SOLN
INTRAMUSCULAR | Status: AC
  Filled 2022-11-28: qty 5

## 2022-11-28 MED ORDER — LACTATED RINGERS IV SOLN: INTRAVENOUS | Status: DC

## 2022-11-28 MED ORDER — 0.9 % SODIUM CHLORIDE (POUR BTL) OPTIME
TOPICAL | Status: DC | PRN
  Administered 2022-11-28: 1000 mL

## 2022-11-28 MED ORDER — LIDOCAINE 2% (20 MG/ML) 5 ML SYRINGE
INTRAMUSCULAR | Status: DC | PRN
  Administered 2022-11-28: 100 mg via INTRAVENOUS

## 2022-11-28 MED ORDER — CHLORHEXIDINE GLUCONATE 0.12 % MT SOLN
OROMUCOSAL | Status: AC
  Filled 2022-11-28: qty 15

## 2022-11-28 MED ORDER — PROPOFOL 10 MG/ML IV BOLUS
INTRAVENOUS | Status: DC | PRN
  Administered 2022-11-28: 200 mg via INTRAVENOUS

## 2022-11-28 MED ORDER — VANCOMYCIN HCL 1000 MG IV SOLR
INTRAVENOUS | Status: AC
  Filled 2022-11-28: qty 20

## 2022-11-28 MED ORDER — ENOXAPARIN SODIUM 30 MG/0.3ML IJ SOSY: 30.0000 mg | PREFILLED_SYRINGE | Freq: Two times a day (BID) | INTRAMUSCULAR | Status: DC

## 2022-11-28 MED ORDER — POLYETHYLENE GLYCOL 3350 17 G PO PACK: 17.0000 g | PACK | Freq: Every day | ORAL | Status: DC | PRN

## 2022-11-28 MED ORDER — DEXAMETHASONE SODIUM PHOSPHATE 10 MG/ML IJ SOLN
INTRAMUSCULAR | Status: AC
  Filled 2022-11-28: qty 1

## 2022-11-28 MED ORDER — EPHEDRINE SULFATE-NACL 50-0.9 MG/10ML-% IV SOSY
PREFILLED_SYRINGE | INTRAVENOUS | Status: DC | PRN
  Administered 2022-11-28 (×2): 5 mg via INTRAVENOUS
  Administered 2022-11-28: 10 mg via INTRAVENOUS
  Administered 2022-11-28: 5 mg via INTRAVENOUS

## 2022-11-28 MED ORDER — ONDANSETRON HCL 4 MG/2ML IJ SOLN
INTRAMUSCULAR | Status: AC
  Filled 2022-11-28: qty 2

## 2022-11-28 MED ORDER — PROPOFOL 500 MG/50ML IV EMUL
INTRAVENOUS | Status: DC | PRN
  Administered 2022-11-28: 20 ug/kg/min via INTRAVENOUS

## 2022-11-28 MED ORDER — ROCURONIUM BROMIDE 10 MG/ML (PF) SYRINGE
PREFILLED_SYRINGE | INTRAVENOUS | Status: AC
  Filled 2022-11-28: qty 10

## 2022-11-28 MED ORDER — ACETAMINOPHEN 10 MG/ML IV SOLN: 1000.0000 mg | Freq: Once | INTRAVENOUS | Status: DC | PRN

## 2022-11-28 MED ORDER — DOCUSATE SODIUM 100 MG PO CAPS: 100.0000 mg | ORAL_CAPSULE | Freq: Two times a day (BID) | ORAL | Status: DC

## 2022-11-28 MED ORDER — METHOCARBAMOL 1000 MG/10ML IJ SOLN: 500.0000 mg | Freq: Three times a day (TID) | INTRAVENOUS | Status: DC | PRN

## 2022-11-28 MED ORDER — ONDANSETRON 4 MG PO TBDP
4.0000 mg | ORAL_TABLET | Freq: Four times a day (QID) | ORAL | Status: DC | PRN
  Administered 2022-12-01: 4 mg via ORAL
  Filled 2022-11-28: qty 1

## 2022-11-28 MED ORDER — PROPOFOL 10 MG/ML IV BOLUS
INTRAVENOUS | Status: AC
  Filled 2022-11-28: qty 20

## 2022-11-28 MED ORDER — MEPERIDINE HCL 25 MG/ML IJ SOLN: 6.2500 mg | INTRAMUSCULAR | Status: DC | PRN

## 2022-11-28 MED ORDER — HYDROMORPHONE HCL 1 MG/ML IJ SOLN
1.0000 mg | Freq: Once | INTRAMUSCULAR | Status: AC
  Administered 2022-11-28: 1 mg via INTRAVENOUS
  Filled 2022-11-28: qty 1

## 2022-11-28 MED ORDER — MIDAZOLAM HCL 2 MG/2ML IJ SOLN
INTRAMUSCULAR | Status: AC
  Filled 2022-11-28: qty 2

## 2022-11-28 MED ORDER — ENOXAPARIN SODIUM 30 MG/0.3ML IJ SOSY
30.0000 mg | PREFILLED_SYRINGE | Freq: Two times a day (BID) | INTRAMUSCULAR | Status: DC
  Administered 2022-11-29 – 2022-12-05 (×13): 30 mg via SUBCUTANEOUS
  Filled 2022-11-28 (×13): qty 0.3

## 2022-11-28 MED ORDER — TRANEXAMIC ACID-NACL 1000-0.7 MG/100ML-% IV SOLN
1000.0000 mg | Freq: Once | INTRAVENOUS | Status: AC
  Administered 2022-11-28: 1000 mg via INTRAVENOUS
  Filled 2022-11-28: qty 100

## 2022-11-28 MED ORDER — PHENYLEPHRINE HCL-NACL 20-0.9 MG/250ML-% IV SOLN
INTRAVENOUS | Status: DC | PRN
  Administered 2022-11-28: 25 ug/min via INTRAVENOUS

## 2022-11-28 SURGICAL SUPPLY — 65 items
ADH SKN CLS APL DERMABOND .7 (GAUZE/BANDAGES/DRESSINGS) ×3
APL PRP STRL LF DISP 70% ISPRP (MISCELLANEOUS) ×6
BAG COUNTER SPONGE SURGICOUNT (BAG) ×4 IMPLANT
BAG SPNG CNTER NS LX DISP (BAG) ×3
BIT DRILL CANN 4.5MM (BIT) IMPLANT
BIT DRILL FLUTED 2.5 (BIT) IMPLANT
BLADE CLIPPER SURG (BLADE) IMPLANT
CHLORAPREP W/TINT 26 (MISCELLANEOUS) ×4 IMPLANT
DERMABOND ADVANCED .7 DNX12 (GAUZE/BANDAGES/DRESSINGS) ×8 IMPLANT
DRAIN CHANNEL 15F RND FF W/TCR (WOUND CARE) IMPLANT
DRAPE C-ARM 42X72 X-RAY (DRAPES) ×4 IMPLANT
DRAPE C-ARMOR (DRAPES) ×4 IMPLANT
DRAPE HALF SHEET 40X57 (DRAPES) ×8 IMPLANT
DRAPE INCISE IOBAN 66X45 STRL (DRAPES) ×8 IMPLANT
DRAPE SURG 17X23 STRL (DRAPES) ×24 IMPLANT
DRAPE U-SHAPE 47X51 STRL (DRAPES) ×4 IMPLANT
DRESSING MEPILEX FLEX 4X4 (GAUZE/BANDAGES/DRESSINGS) ×8 IMPLANT
DRILL BIT FLUTED 2.5 (BIT) ×3
DRSG MEPILEX FLEX 4X4 (GAUZE/BANDAGES/DRESSINGS)
DRSG MEPILEX POST OP 4X8 (GAUZE/BANDAGES/DRESSINGS) ×8 IMPLANT
ELECT REM PT RETURN 9FT ADLT (ELECTROSURGICAL) ×3
ELECTRODE REM PT RTRN 9FT ADLT (ELECTROSURGICAL) ×4 IMPLANT
EVACUATOR SILICONE 100CC (DRAIN) IMPLANT
GLOVE BIO SURGEON STRL SZ 6.5 (GLOVE) ×12 IMPLANT
GLOVE BIO SURGEON STRL SZ7.5 (GLOVE) ×16 IMPLANT
GLOVE BIOGEL PI IND STRL 6.5 (GLOVE) ×4 IMPLANT
GLOVE BIOGEL PI IND STRL 7.5 (GLOVE) ×4 IMPLANT
GOWN STRL REUS W/ TWL LRG LVL3 (GOWN DISPOSABLE) ×8 IMPLANT
GOWN STRL REUS W/TWL LRG LVL3 (GOWN DISPOSABLE) ×6
GUIDEWIRE 2.0MM (WIRE) IMPLANT
GUIDEWIRE 2.8 THREAD 450 L ST (WIRE) ×6 IMPLANT
KIT BASIN OR (CUSTOM PROCEDURE TRAY) ×4 IMPLANT
KIT TURNOVER KIT B (KITS) ×4 IMPLANT
MANIFOLD NEPTUNE II (INSTRUMENTS) ×4 IMPLANT
NS IRRIG 1000ML POUR BTL (IV SOLUTION) ×8 IMPLANT
PACK TOTAL JOINT (CUSTOM PROCEDURE TRAY) ×4 IMPLANT
PACK UNIVERSAL I (CUSTOM PROCEDURE TRAY) ×4 IMPLANT
PAD ARMBOARD 7.5X6 YLW CONV (MISCELLANEOUS) ×8 IMPLANT
PLATE PELVIC 4H 3.5 (Plate) IMPLANT
PLATE SYMPHYSIS PUBIC 3.5 4H (Plate) IMPLANT
SCREW CANN FT 7.5X150 STRL (Screw) IMPLANT
SCREW CORTEX 3.5X45MM (Screw) IMPLANT
SCREW LOCK CORT ST 3.5X20 (Screw) IMPLANT
SCREW LOCK CORT ST 3.5X22 (Screw) IMPLANT
SCREW LOCK CORT ST 3.5X24 (Screw) IMPLANT
SCREW LOCK CORT ST 3.5X26 (Screw) IMPLANT
SCREW LOCK CORT ST 3.5X38 (Screw) ×3 IMPLANT
SCREW PELVIC CORT ST 3.5X50 (Screw) IMPLANT
SPONGE T-LAP 18X18 ~~LOC~~+RFID (SPONGE) IMPLANT
STAPLER VISISTAT 35W (STAPLE) ×4 IMPLANT
SUCTION FRAZIER HANDLE 10FR (MISCELLANEOUS) ×3
SUCTION TUBE FRAZIER 10FR DISP (MISCELLANEOUS) ×4 IMPLANT
SUT MNCRL AB 3-0 PS2 18 (SUTURE) ×4 IMPLANT
SUT MON AB 2-0 CT1 36 (SUTURE) ×4 IMPLANT
SUT VIC AB 0 CT1 27 (SUTURE) ×6
SUT VIC AB 0 CT1 27XBRD ANBCTR (SUTURE) ×8 IMPLANT
SUT VIC AB 1 CT1 18XCR BRD 8 (SUTURE) IMPLANT
SUT VIC AB 1 CT1 8-18 (SUTURE)
SUT VIC AB 2-0 CT1 27 (SUTURE) ×6
SUT VIC AB 2-0 CT1 TAPERPNT 27 (SUTURE) ×8 IMPLANT
TOWEL GREEN STERILE (TOWEL DISPOSABLE) ×8 IMPLANT
TOWEL GREEN STERILE FF (TOWEL DISPOSABLE) ×4 IMPLANT
TRAY FOLEY MTR SLVR 16FR STAT (SET/KITS/TRAYS/PACK) IMPLANT
WASHER F/7.5 CANN SCREW (Washer) IMPLANT
WATER STERILE IRR 1000ML POUR (IV SOLUTION) ×16 IMPLANT

## 2022-11-28 NOTE — ED Notes (Signed)
This RN gave transfer of care report to [place]. Consent signed by this RN and pt's mother and given to pt's mother to give to RN in preop.
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
## 2022-11-28 NOTE — Transfer of Care (Signed)
Immediate Anesthesia Transfer of Care Note  Patient: Stacey Bates  Procedure(s) Performed: OPEN REDUCTION INTERNAL FIXATION (ORIF) PELVIC FRACTURE (Right) INSERTION OF TRACTION PIN (Leg Lower) CYSTOGRAM  Patient Location: PACU  Anesthesia Type:General  Level of Consciousness: awake and drowsy  Airway & Oxygen Therapy: Patient Spontanous Breathing  Post-op Assessment: Report given to RN and Post -op Vital signs reviewed and stable  Post vital signs: Reviewed and stable  Last Vitals:  Vitals Value Taken Time  BP 118/71 11/28/22 1307  Temp    Pulse 127 11/28/22 1311  Resp 25 11/28/22 1311  SpO2 93 % 11/28/22 1311  Vitals shown include unvalidated device data.  Last Pain:  Vitals:   11/28/22 0922  TempSrc:   PainSc: 6       Patients Stated Pain Goal: 0 ([id number])  Complications: No notable events documented.
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
## 2022-11-28 NOTE — ED Notes (Addendum)
Pt removed off Flat board from medic, still in C-collar with no C-spine tenderness.
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
## 2022-11-28 NOTE — Anesthesia Preprocedure Evaluation (Signed)
Anesthesia Evaluation    Airway Mallampati: I       Dental no notable dental hx.    Pulmonary former smoker   Pulmonary exam normal        Cardiovascular Normal cardiovascular exam     Neuro/Psych    GI/Hepatic   Endo/Other    Renal/GU      Musculoskeletal   Abdominal Normal abdominal exam  (+)   Peds  Hematology   Anesthesia Other Findings   Reproductive/Obstetrics                             Anesthesia Physical Anesthesia Plan  ASA: 1  Anesthesia Plan: General   Post-op Pain Management: Dilaudid IV   Induction: Intravenous  PONV Risk Score and Plan: 4 or greater  Airway Management Planned: Oral ETT  Additional Equipment: None  Intra-op Plan:   Post-operative Plan: Extubation in OR  Informed Consent: I have reviewed the patients History and Physical, chart, labs and discussed the procedure including the risks, benefits and alternatives for the proposed anesthesia with the patient or authorized representative who has indicated his/her understanding and acceptance.     Dental advisory given  Plan Discussed with: CRNA  Anesthesia Plan Comments:        Anesthesia Quick Evaluation
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
## 2022-11-28 NOTE — Anesthesia Procedure Notes (Signed)
Procedure Name: Intubation Date/Time: 11/28/2022 9:47 AM  Performed by: Dorthea Cove, CRNAPre-anesthesia Checklist: Patient identified, Emergency Drugs available, Suction available and Patient being monitored Patient Re-evaluated:Patient Re-evaluated prior to induction Oxygen Delivery Method: Circle system utilized Preoxygenation: Pre-oxygenation with 100% oxygen Induction Type: IV induction Ventilation: Mask ventilation without difficulty Laryngoscope Size: Mac and 3 Grade View: Grade I Tube type: Oral Tube size: 7.0 mm Number of attempts: 1 Airway Equipment and Method: Stylet and Oral airway Placement Confirmation: ETT inserted through vocal cords under direct vision, positive ETCO2 and breath sounds checked- equal and bilateral Secured at: 21 cm Tube secured with: Tape Dental Injury: Teeth and Oropharynx as per pre-operative assessment

## 2022-11-28 NOTE — TOC CAGE-AID Note (Signed)
Transition of Care [facility]) - CAGE-AID Screening   Patient Details  Name: Stacey Bates MRN: [id number] Date of Birth: [birth year]  Transition of Care (TOC) CM/SW Contact:    Army Melia, RN Phone Number:[phone] 11/28/2022, 3:55 AM   CAGE-AID Screening:    Have You Ever Felt You Ought to Cut Down on Your Drinking or Drug Use?: No Have People Annoyed You By Critizing Your Drinking Or Drug Use?: No Have You Felt Bad Or Guilty About Your Drinking Or Drug Use?: No Have You Ever Had a Drink or Used Drugs First Thing In The Morning to Steady Your Nerves or to Get Rid of a Hangover?: No CAGE-AID Score: 0  Substance Abuse Education Offered: No
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
## 2022-11-28 NOTE — Progress Notes (Signed)
PT Cancellation Note  Patient Details Name: DONNY DUBBERT MRN: [id number] DOB: [birth year]   Cancelled Treatment:    Reason Eval/Treat Not Completed: Other (comment)  Transporting off unit for CT. Will follow-up for PT evaluation as time allows, likely tomorrow morning.  Candie Mile, PT, DPT Physical Therapist Acute Rehabilitation Services [facility]   Ellouise Newer 11/28/2022, 3:25 PM
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
## 2022-11-28 NOTE — Progress Notes (Signed)
   11/28/22 0000  Spiritual Encounters  Type of Visit Initial  Care provided to: Patient  Referral source Trauma page  Reason for visit Trauma  OnCall Visit Yes   Ch responded to trauma page. No family is present at bedside. No follow-up needed at this time.
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
## 2022-11-28 NOTE — Progress Notes (Signed)
Orthopedic Tech Progress Note Patient Details:  Stacey Bates 09/02/2000 [id number]  Patient ID: Stacey Bates, female   DOB: 2001/05/05, 22 y.o.   MRN: [id number] I attended trauma page. Karolee Stamps 11/28/2022, 12:47 AM

## 2022-11-28 NOTE — Interval H&P Note (Signed)
History and Physical Interval Note:  11/28/2022 9:26 AM  Stacey Bates  has presented today for surgery, with the diagnosis of Pelvic fracture.  The various methods of treatment have been discussed with the patient and family. After consideration of risks, benefits and other options for treatment, the patient has consented to  Procedure(s): OPEN REDUCTION INTERNAL FIXATION (ORIF) PELVIC FRACTURE (Right) as a surgical intervention.  The patient's history has been reviewed, patient examined, no change in status, stable for surgery.  I have reviewed the patient's chart and labs.  Questions were answered to the patient's satisfaction.     Lennette Bihari P Laquitha Heslin

## 2022-11-29 ENCOUNTER — Inpatient Hospital Stay (HOSPITAL_COMMUNITY): Payer: BC Managed Care – PPO

## 2022-11-29 LAB — CBC
HCT: 21.1 % — ABNORMAL LOW (ref 36.0–46.0)
HCT: 24.2 % — ABNORMAL LOW (ref 36.0–46.0)
Hemoglobin: 6.8 g/dL — CL (ref 12.0–15.0)
Hemoglobin: 8.1 g/dL — ABNORMAL LOW (ref 12.0–15.0)
MCH: 28.5 pg (ref 26.0–34.0)
MCH: 28.6 pg (ref 26.0–34.0)
MCHC: 32.2 g/dL (ref 30.0–36.0)
MCHC: 33.5 g/dL (ref 30.0–36.0)
MCV: 88.3 fL (ref 80.0–100.0)
MCV: 85.5 fL (ref 80.0–100.0)
Platelets: 174 10*3/uL (ref 150–400)
Platelets: 149 10*3/uL — ABNORMAL LOW (ref 150–400)
RBC: 2.39 MIL/uL — ABNORMAL LOW (ref 3.87–5.11)
RBC: 2.83 MIL/uL — ABNORMAL LOW (ref 3.87–5.11)
RDW: 12.8 % (ref 11.5–15.5)
RDW: 13.8 % (ref 11.5–15.5)
WBC: 11.9 10*3/uL — ABNORMAL HIGH (ref 4.0–10.5)
WBC: 9 10*3/uL (ref 4.0–10.5)
nRBC: 0 % (ref 0.0–0.2)
nRBC: 0 % (ref 0.0–0.2)

## 2022-11-29 LAB — BASIC METABOLIC PANEL
Anion gap: 8 (ref 5–15)
BUN: 5 mg/dL — ABNORMAL LOW (ref 6–20)
CO2: 25 mmol/L (ref 22–32)
Calcium: 8.7 mg/dL — ABNORMAL LOW (ref 8.9–10.3)
Chloride: 102 mmol/L (ref 98–111)
Creatinine, Ser: 0.76 mg/dL (ref 0.44–1.00)
GFR, Estimated: >60 mL/min (ref >60)
Glucose, Bld: 113 mg/dL — ABNORMAL HIGH (ref 70–99)
Potassium: 3.7 mmol/L (ref 3.5–5.1)
Sodium: 135 mmol/L (ref 135–145)

## 2022-11-29 LAB — VITAMIN D 25 HYDROXY (VIT D DEFICIENCY, FRACTURES): Vit D, 25-Hydroxy: 9.48 ng/mL — ABNORMAL LOW (ref 30–100)

## 2022-11-29 LAB — ABO/RH: ABO/RH(D): O POS

## 2022-11-29 LAB — PREPARE RBC (CROSSMATCH)

## 2022-11-29 MED ORDER — METHOCARBAMOL 750 MG PO TABS
750.0000 mg | ORAL_TABLET | Freq: Three times a day (TID) | ORAL | Status: DC
  Administered 2022-11-29 – 2022-12-02 (×12): 750 mg via ORAL
  Filled 2022-11-29 (×12): qty 1

## 2022-11-29 MED ORDER — SODIUM CHLORIDE 0.9% IV SOLUTION: Freq: Once | INTRAVENOUS | Status: AC

## 2022-11-29 MED ORDER — VITAMIN D (ERGOCALCIFEROL) 1.25 MG (50000 UNIT) PO CAPS
50000.0000 [IU] | ORAL_CAPSULE | ORAL | Status: DC
  Administered 2022-11-29: 50000 [IU] via ORAL
  Filled 2022-11-29: qty 1

## 2022-11-29 MED ORDER — POLYETHYLENE GLYCOL 3350 17 G PO PACK
17.0000 g | PACK | Freq: Every day | ORAL | Status: DC
  Administered 2022-11-30 – 2022-12-01 (×2): 17 g via ORAL
  Filled 2022-11-29 (×2): qty 1

## 2022-11-29 MED ORDER — LEVOFLOXACIN 500 MG PO TABS
750.0000 mg | ORAL_TABLET | Freq: Once | ORAL | Status: AC
  Administered 2022-11-29: 750 mg via ORAL
  Filled 2022-11-29: qty 2

## 2022-11-29 MED ORDER — OXYCODONE HCL 5 MG PO TABS
5.0000 mg | ORAL_TABLET | ORAL | Status: DC | PRN
  Administered 2022-11-29 – 2022-11-30 (×3): 10 mg via ORAL
  Administered 2022-11-30: 5 mg via ORAL
  Administered 2022-12-01 – 2022-12-05 (×4): 10 mg via ORAL
  Filled 2022-11-29 (×7): qty 2
  Filled 2022-11-29: qty 1
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
## 2022-11-29 NOTE — Progress Notes (Signed)
     Subjective:  Patient reports pain as moderate.  POD1 of ORIF pelvic fracture.  Orthopedic PA on call notified of critical Hgb 6.8 this morning and 1 unit of PRBC ordered.  Pt has not received transfusion at this time.  Reports pain as constant though helped with pain meds.  Denies lightheadedness, N/V, fevers, chest pain, shortness of breath, though does endorse some intermittent chills.    Objective:   VITALS:   Vitals:   11/28/22 2236 11/28/22 2330 11/29/22 0012 11/29/22 0402  BP: (!) 153/102 126/79 118/87 118/68  Pulse: (!) 112 77 80 72  Resp: 20  20 20   Temp: 97.6 F (36.4 C) 98.2 F (36.8 C) 98.3 F (36.8 C) 98.2 F (36.8 C)  TempSrc: Oral Oral Oral Oral  SpO2: 100% 100% 100% 100%  Weight:      Height:        ABD soft Neurovascular intact Intact pulses distally Dorsiflexion/Plantar flexion intact Incision: dressing C/D/I and no drainage No cellulitis present  Lab Results  Component Value Date   WBC 11.9 (H) 11/29/2022   HGB 6.8 (LL) 11/29/2022   HCT 21.1 (L) 11/29/2022   MCV 88.3 11/29/2022   PLT 174 11/29/2022   BMET    Component Value Date/Time   NA 135 11/29/2022 0356   K 3.7 11/29/2022 0356   CL 102 11/29/2022 0356   CO2 25 11/29/2022 0356   GLUCOSE 113 (H) 11/29/2022 0356   BUN 5 (L) 11/29/2022 0356   CREATININE 0.76 11/29/2022 0356   CALCIUM 8.7 (L) 11/29/2022 0356   GFRNONAA >60 11/29/2022 0356    IMAGING  11/28/22  XR pelvis: S/P ORIF bilateral SI joints and pubic symphysis, components appear well-aligned  11/28/22  CT pelvis WO contrast: Postsurgical changes of percutaneous sacroiliac joint fixation and ORIF of the pubic symphysis for a lateral compression pelvic injury.  Improved, near anatomic pubic symphyseal alignment. No evidence of immediate hardware complication.  Also noted unchanged right L5 transverse process fracture.  Assessment/Plan:  S/P pelvic fracture ORIF with Dr. Doreatha Martin 1 Day Post-Op   Principal Problem:   Multiple  pelvic fractures (HCC)   POD1       - Proceed with transfusion of 1 unit PRBC, expected to begin in the next 30-60 min per nursing staff - CBC reordered for today, trend Hgb with daily CBC, may recommend additional transfusion(s) as needed - Tele/VS  - Remain NWB: 4-6 weeks on lower LLE, 8-10 weeks on RLE - Receive post-op Ancef - Continue Lovenox for DVT prophylaxis - PT/OT to help mobilize   [place] [id number], 8:50 AM   Charlies Constable, MD  Contact information:   [phone] 7am-5pm epic message Dr. Zachery Dakins, or call office for patient follow up: [phone] After hours and holidays please check [email or web address] for group call information for [facility]
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
## 2022-11-29 NOTE — Progress Notes (Signed)
RN received call from tele stating pts HR in 140s/150s. On assessment patient in bed tearful and stated that she spilt water on her herself and began to have a lot of pain. Parents at bedside at this time. Vitals taken and patient began to calm down. PRN medication given. Vitals retaken, pt running NSR per tele on monitor and parents left. Provider notified. No new interventions at that time.

## 2022-11-29 NOTE — Anesthesia Postprocedure Evaluation (Signed)
Anesthesia Post Note  Patient: Stacey Bates  Procedure(s) Performed: OPEN REDUCTION INTERNAL FIXATION (ORIF) PELVIC FRACTURE (Right) INSERTION OF TRACTION PIN (Leg Lower) CYSTOGRAM     Patient location during evaluation: PACU Anesthesia Type: General Level of consciousness: sedated Pain management: pain level controlled Vital Signs Assessment: post-procedure vital signs reviewed and stable Respiratory status: spontaneous breathing Cardiovascular status: stable Postop Assessment: no apparent nausea or vomiting Anesthetic complications: no  No notable events documented.  Last Vitals:  Vitals:   11/29/22 0913 11/29/22 0950  BP: 110/70 108/61  Pulse: 92 79  Resp: 14 16  Temp: 37.1 C 36.9 C  SpO2: 100% 100%    Last Pain:  Vitals:   11/29/22 0950  TempSrc: Oral  PainSc:                  Huston Foley

## 2022-11-29 NOTE — Progress Notes (Signed)
Inpatient Rehab Admissions Coordinator:   Per OT recommendation, patient was screened for CIR candidacy by Clemens Catholic, MS, CCC-SLP ). At this time, Pt. is not yet tolerating OOB and is not a level to tolerate the intensity of CIR. However,   Pt. may have potential to progress to becoming a potential CIR candidate, so CIR admissions team will follow and monitor for progress and participation with therapies and place consult order if Pt. appears to be an appropriate candidate. Please contact me with any questions.   Clemens Catholic, [place], [place] Admissions Coordinator  [phone] ([place]) [phone] (office)

## 2022-11-29 NOTE — Progress Notes (Signed)
Called regarding Hbg critical value of 6.8. I discussed over the phone with patient. Patient symptomatic, states she has some lightheadedness and dizziness this morning. Discussed risks and benefits of a blood transfusion, she wishes to proceed. 1 Unit PRBC ordered.   Merlene Pulling, PA-C

## 2022-11-29 NOTE — Progress Notes (Signed)
The RN went to patient's room due to commotion going on. When arrived parents of the patient were leaving the room along with patient's brother. When confronting the patient about what was happening, patient was very upset and stated she did not want them back in the room. I receive permission to prevent patient's parents Gwyndolyn Saxon and Biomedical engineer) from coming to this floor. I have placed a password on patient's chart for further [place]. No information should be given to parents unless patient gives permission.

## 2022-11-29 NOTE — Progress Notes (Signed)
Trauma Event Note   TRN at bedside to round. Pt complaining of foley catheter pain, pelvic pain worse on the left side. Foley functioning appropriately, urine output WDL. Pain alleviated  when foley was slightly advanced. Bladder scanned as a precaution, 14ML noted in bladder. Caryl Pina primary RN preparing PRN pain management. TRNs will continue to follow, available 24/7 if needed.  Stacey Bates O Abrahim Sargent  Trauma Response RN  Please call TRN at [phone] for further assistance.
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
## 2022-11-30 LAB — BPAM RBC
Blood Product Expiration Date: 202404262359
ISSUE DATE / TIME: 202403300922
Unit Type and Rh: 5100

## 2022-11-30 LAB — CBC
HCT: 23.5 % — ABNORMAL LOW (ref 36.0–46.0)
HCT: 24.5 % — ABNORMAL LOW (ref 36.0–46.0)
Hemoglobin: 7.6 g/dL — ABNORMAL LOW (ref 12.0–15.0)
Hemoglobin: 8.1 g/dL — ABNORMAL LOW (ref 12.0–15.0)
MCH: 28.4 pg (ref 26.0–34.0)
MCH: 28.5 pg (ref 26.0–34.0)
MCHC: 32.3 g/dL (ref 30.0–36.0)
MCHC: 33.1 g/dL (ref 30.0–36.0)
MCV: 87.7 fL (ref 80.0–100.0)
MCV: 86.3 fL (ref 80.0–100.0)
Platelets: 138 10*3/uL — ABNORMAL LOW (ref 150–400)
Platelets: 159 10*3/uL (ref 150–400)
RBC: 2.68 MIL/uL — ABNORMAL LOW (ref 3.87–5.11)
RBC: 2.84 MIL/uL — ABNORMAL LOW (ref 3.87–5.11)
RDW: 14.2 % (ref 11.5–15.5)
RDW: 13.8 % (ref 11.5–15.5)
WBC: 7.3 10*3/uL (ref 4.0–10.5)
WBC: 6.7 10*3/uL (ref 4.0–10.5)
nRBC: 0 % (ref 0.0–0.2)
nRBC: 0 % (ref 0.0–0.2)

## 2022-11-30 LAB — BASIC METABOLIC PANEL
Anion gap: 8 (ref 5–15)
BUN: 6 mg/dL (ref 6–20)
CO2: 26 mmol/L (ref 22–32)
Calcium: 8.4 mg/dL — ABNORMAL LOW (ref 8.9–10.3)
Chloride: 103 mmol/L (ref 98–111)
Creatinine, Ser: 0.81 mg/dL (ref 0.44–1.00)
GFR, Estimated: >60 mL/min (ref >60)
Glucose, Bld: 94 mg/dL (ref 70–99)
Potassium: 3.7 mmol/L (ref 3.5–5.1)
Sodium: 137 mmol/L (ref 135–145)

## 2022-11-30 LAB — TYPE AND SCREEN
ABO/RH(D): O POS
Antibody Screen: NEGATIVE
Unit division: 0
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
## 2022-12-01 LAB — CBC
HCT: 23.9 % — ABNORMAL LOW (ref 36.0–46.0)
Hemoglobin: 7.8 g/dL — ABNORMAL LOW (ref 12.0–15.0)
MCH: 28.2 pg (ref 26.0–34.0)
MCHC: 32.6 g/dL (ref 30.0–36.0)
MCV: 86.3 fL (ref 80.0–100.0)
Platelets: 162 10*3/uL (ref 150–400)
RBC: 2.77 MIL/uL — ABNORMAL LOW (ref 3.87–5.11)
RDW: 13.5 % (ref 11.5–15.5)
WBC: 6.5 10*3/uL (ref 4.0–10.5)
nRBC: 0 % (ref 0.0–0.2)

## 2022-12-01 MED ORDER — SENNA 8.6 MG PO TABS
1.0000 | ORAL_TABLET | Freq: Every day | ORAL | Status: DC
  Administered 2022-12-01 – 2022-12-04 (×4): 8.6 mg via ORAL
  Filled 2022-12-01 (×4): qty 1

## 2022-12-01 MED ORDER — NITROFURANTOIN MONOHYD MACRO 100 MG PO CAPS
100.0000 mg | ORAL_CAPSULE | Freq: Two times a day (BID) | ORAL | Status: DC
  Administered 2022-12-01 – 2022-12-02 (×4): 100 mg via ORAL
  Filled 2022-12-01 (×5): qty 1

## 2022-12-01 MED ORDER — POLYETHYLENE GLYCOL 3350 17 G PO PACK
17.0000 g | PACK | Freq: Two times a day (BID) | ORAL | Status: DC
  Administered 2022-12-01 – 2022-12-04 (×6): 17 g via ORAL
  Filled 2022-12-01 (×8): qty 1
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
## 2022-12-01 NOTE — Plan of Care (Signed)
  Problem: Health Behavior/Discharge Planning: Goal: Ability to manage health-related needs will improve Outcome: Progressing   Problem: Clinical Measurements: Goal: Will remain free from infection Outcome: Progressing   Problem: Activity: Goal: Risk for activity intolerance will decrease Outcome: Progressing   Problem: Elimination: Goal: Will not experience complications related to bowel motility Outcome: Progressing Goal: Will not experience complications related to urinary retention Outcome: Progressing   Problem: Pain Managment: Goal: General experience of comfort will improve Outcome: Progressing   Problem: Safety: Goal: Ability to remain free from injury will improve Outcome: Progressing   Problem: Skin Integrity: Goal: Risk for impaired skin integrity will decrease Outcome: Progressing   

## 2022-12-01 NOTE — Progress Notes (Signed)
ORTHO TRAUMA PROGRESS NOTE   Subjective: Patient doing fairly well this morning.  Pain controlled.  Patient did get up in the middle of the night unassisted over the weekend.  Put some weight through both leg legs using the walker.  Per the notes, patient reported increased pain afterwards but when asked this AM said the pain has since resolved.  Having some occasional nausea when taking pain medication but reports not having much of an appetite.  No vomiting.  Denies numbness, tingling, chest pain, shortness of breath.    Objective:   VITALS:   Vitals:   11/30/22 1233 11/30/22 2037 12/01/22 0500 12/01/22 0733  BP: (!) 127/92 120/75 108/72 115/79  Pulse: 68 75 (!) 56 (!) 59  Resp: 18   18  Temp: 98.4 F (36.9 C) 98.7 F (37.1 C) 98.9 F (37.2 C) 98.4 F (36.9 C)  TempSrc: Oral Oral Oral Oral  SpO2: 100% 100% 99% 100%  Weight:      Height:        ABD soft Neurovascular intact Sensation intact distally Intact pulses distally Dorsiflexion/Plantar flexion intact Incision: dressing C/D/I and no drainage No cellulitis present Compartment soft Wiggles toes appropriately   IMAGING: Stable post op imaging.  LABS:  Results for orders placed or performed during the hospital encounter of 11/28/22 (from the past 24 hour(s))  CBC     Status: Abnormal   Collection Time: 11/30/22  2:30 PM  Result Value Ref Range   WBC 6.7 4.0 - 10.5 K/uL   RBC 2.84 (L) 3.87 - 5.11 MIL/uL   Hemoglobin 8.1 (L) 12.0 - 15.0 g/dL   HCT 24.5 (L) 36.0 - 46.0 %   MCV 86.3 80.0 - 100.0 fL   MCH 28.5 26.0 - 34.0 pg   MCHC 33.1 30.0 - 36.0 g/dL   RDW 13.8 11.5 - 15.5 %   Platelets 159 150 - 400 K/uL   nRBC 0.0 0.0 - 0.2 %  CBC     Status: Abnormal   Collection Time: 12/01/22  2:46 AM  Result Value Ref Range   WBC 6.5 4.0 - 10.5 K/uL   RBC 2.77 (L) 3.87 - 5.11 MIL/uL   Hemoglobin 7.8 (L) 12.0 - 15.0 g/dL   HCT 23.9 (L) 36.0 - 46.0 %   MCV 86.3 80.0 - 100.0 fL   MCH 28.2 26.0 - 34.0 pg   MCHC 32.6  30.0 - 36.0 g/dL   RDW 13.5 11.5 - 15.5 %   Platelets 162 150 - 400 K/uL   nRBC 0.0 0.0 - 0.2 %    ASSESSMENT: Stacey Bates is a 22 y.o. female, 3 Days Post-Op s/p OPEN REDUCTION INTERNAL FIXATION (ORIF) PELVIC FRACTURE INSERTION/REMOVAL OF TRACTION PIN CYSTOGRAM  CV/Blood loss: Acute blood loss anemia, Hgb 7.8 this morning.  Slight drop from yesterday. Hemodynamically stable  PLAN: Weightbearing: NWB LLE 4-6 weeks, NWB RLE 8-10 weeks ROM: Okay for unrestricted hip and knee motion as tolerated Incisional and dressing care: Incisions may be left open to air. Showering: Okay to shower today and get incisions wet Orthopedic device(s): None  Pain management:  1. Tylenol 1000 mg q 6 hours scheduled 2. Robaxin 750 mg TID 3. Oxycodone 5-10 mg q 4 hours PRN 4. Dilaudid 0.5-1 mg q 3 hours PRN VTE prophylaxis: Lovenox, SCDs ID:  Ancef 2gm post op completed Foley/Lines:  Foley remains in place. D/c per trauma/urology. KVO IVFs Impediments to Fracture Healing: Vit D level 9, started on supplementation Dispo: PT/OT eval ongoing.  Continue to monitor CBC.  Discussed the importance of remaining NWB BLE with the patient this AM. She notes her understanding. Okay for discharge from ortho standpoint once cleared by medicine team and therapies  D/C recommendations: - Oxycodone, Robaxin, Tylenol for pain control - Eliquis 2.5 mg BID for DVT prophylaxis - Continue 50,000 units Vit D supplementation q 7 days x 8 weeks  Follow - up plan: 2 weeks after d/c   Contact information:  Katha Hamming MD, Rushie Nyhan PA-C. After hours and holidays please check [email or web address] for group call information for [facility]   Gwinda Passe, PA-C [phone] (office) [email or web address]
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
## 2022-12-01 NOTE — Plan of Care (Signed)
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
## 2022-12-02 MED ORDER — MAGNESIUM CITRATE PO SOLN
0.5000 | Freq: Once | ORAL | Status: DC
  Filled 2022-12-02: qty 296
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
## 2022-12-02 NOTE — Plan of Care (Signed)
  Problem: Clinical Measurements: Goal: Will remain free from infection Outcome: Progressing Goal: Diagnostic test results will improve Outcome: Progressing   Problem: Nutrition: Goal: Adequate nutrition will be maintained Outcome: Progressing   Problem: Pain Managment: Goal: General experience of comfort will improve Outcome: Progressing   Problem: Safety: Goal: Ability to remain free from injury will improve Outcome: Progressing   Problem: Skin Integrity: Goal: Risk for impaired skin integrity will decrease Outcome: Progressing   
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
## 2022-12-02 NOTE — Progress Notes (Signed)
Inpatient Rehab Admissions:  Inpatient Rehab Consult received.  I met with patient at the bedside for rehabilitation assessment and to discuss goals and expectations of an inpatient rehab admission.  Discussed average length of stay, insurance authorization requirement, discharge home after completion of CIR. Pt acknowledged understanding. Pt interested in pursuing CIR. Pt mentioned that a family member Lattie Haw would be able to provide support after discharge. Pt unable to provide Lisa's number at this time. Will continue to follow.   Signed: Gayland Curry, [place], [place] Admissions Coordinator [phone]

## 2022-12-03 MED ORDER — KETOROLAC TROMETHAMINE 15 MG/ML IJ SOLN
30.0000 mg | Freq: Four times a day (QID) | INTRAMUSCULAR | Status: DC
  Administered 2022-12-03 – 2022-12-04 (×5): 30 mg via INTRAVENOUS
  Filled 2022-12-03 (×5): qty 2

## 2022-12-03 MED ORDER — METHOCARBAMOL 500 MG PO TABS
1000.0000 mg | ORAL_TABLET | Freq: Four times a day (QID) | ORAL | Status: DC
  Administered 2022-12-03 – 2022-12-05 (×11): 1000 mg via ORAL
  Filled 2022-12-03 (×11): qty 2

## 2022-12-03 MED ORDER — HYDROMORPHONE HCL 1 MG/ML IJ SOLN
0.5000 mg | INTRAMUSCULAR | Status: DC | PRN
  Administered 2022-12-03 – 2022-12-05 (×4): 0.5 mg via INTRAVENOUS
  Filled 2022-12-03 (×5): qty 0.5
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
## 2022-12-03 NOTE — Plan of Care (Signed)

## 2022-12-03 NOTE — Progress Notes (Signed)
IP rehab admissions - I met with patient today.  Patient did speak with boyfriend Ovid Curd and got BF's mom phone number.  Lajuana Carry, Lattie Haw, is boyfriend's mom.  I spoke with Lattie Haw and she did confirm that patient can come home to her house after rehab stay.  We have opened the case with [employer] requesting acute inpatient rehab admission.  I will follow up once I hear back from insurance carrier.  Call for questions.  [phone]
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
## 2022-12-04 ENCOUNTER — Encounter (HOSPITAL_COMMUNITY): Payer: Self-pay | Admitting: Student

## 2022-12-04 MED ORDER — MAGNESIUM CITRATE PO SOLN: 0.5000 | Freq: Every day | ORAL | Status: DC | PRN
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
## 2022-12-05 ENCOUNTER — Other Ambulatory Visit (HOSPITAL_COMMUNITY): Payer: Self-pay

## 2022-12-05 LAB — CREATININE, SERUM
Creatinine, Ser: 0.65 mg/dL (ref 0.44–1.00)
GFR, Estimated: >60 mL/min (ref >60)

## 2022-12-05 MED ORDER — POLYETHYLENE GLYCOL 3350 17 G PO PACK: 17.0000 g | PACK | Freq: Every day | ORAL | 0 refills | Status: DC | PRN

## 2022-12-05 MED ORDER — VITAMIN D (ERGOCALCIFEROL) 1.25 MG (50000 UNIT) PO CAPS
50000.0000 [IU] | ORAL_CAPSULE | ORAL | 0 refills | Status: AC
  Filled 2022-12-05: qty 4, 28d supply, fill #0

## 2022-12-05 MED ORDER — ACETAMINOPHEN 500 MG PO TABS: 1000.0000 mg | ORAL_TABLET | Freq: Four times a day (QID) | ORAL | Status: DC | PRN

## 2022-12-05 MED ORDER — METHOCARBAMOL 500 MG PO TABS
1000.0000 mg | ORAL_TABLET | Freq: Four times a day (QID) | ORAL | 0 refills | Status: AC | PRN
  Filled 2022-12-05: qty 56, 7d supply, fill #0

## 2022-12-05 MED ORDER — OXYCODONE HCL 5 MG PO TABS
5.0000 mg | ORAL_TABLET | ORAL | 0 refills | Status: AC | PRN
  Filled 2022-12-05: qty 24, 5d supply, fill #0

## 2022-12-05 MED ORDER — DOCUSATE SODIUM 100 MG PO CAPS: 100.0000 mg | ORAL_CAPSULE | Freq: Two times a day (BID) | ORAL | Status: DC | PRN

## 2022-12-05 MED ORDER — IBUPROFEN 400 MG PO TABS: 400.0000 mg | ORAL_TABLET | Freq: Four times a day (QID) | ORAL | Status: AC | PRN

## 2022-12-05 MED ORDER — APIXABAN 2.5 MG PO TABS
2.5000 mg | ORAL_TABLET | Freq: Two times a day (BID) | ORAL | 1 refills | Status: DC
  Filled 2022-12-05: qty 60, 30d supply, fill #0
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
## 2022-12-05 NOTE — TOC Progression Note (Addendum)
Transition of Care [facility]) - Progression Note    Patient Details  Name: Stacey Bates MRN: [id number] Date of Birth: 2000-11-03  Transition of [facility]) CM/SW Contact  Glennon Mac, RN Phone Number: 12/05/2022, 12:11pm   Clinical Narrative:    Met with patient to discuss discharge arrangements.  She feels she can safely go home and wants to go today. She states she will dc home with her ex-boyfriend's parents, who can provide 24h supervision/assistance.  She states they have a BSC and shower chair for her to use.  Referral to [employer] for 16" wheelchair, to be delivered to bedside prior to dc.  Able to secure [facility] services with [facility] for HHPT/OT.  Discharge address is [address]; [place], [place], [address].  PT to see patient this afternoon; we attempted to have caregiver come in for teaching, but they cannot come until 6pm. Therapist states she has a handout that she can provide, and will review with patient.  Notified provider that patient wishes to dc today, if possible; PA states she will evaluate later today for decision. Would recommend sending discharge meds to [facility] to be filled.    Answered patient's questions regarding Medicaid and disability.  She understands that she would not be eligible for Medicaid, as she will not be disabled for over a year.    Addendum: 5:15pm Patient declined WC from [employer], as has copay of $166.  She states she has a WC that she can borrow from her boyfriend.      Expected Discharge Plan: Home w Home Health Services Barriers to Discharge: Barriers Resolved  Expected Discharge Plan and Services   Discharge Planning Services: CM Consult Post Acute Care Choice: Home Health Living arrangements for the past 2 months: Apartment                 DME Arranged: Wheelchair manual   Date DME Agency Contacted: 12/05/22 Time DME Agency Contacted: 1220 Representative spoke with at DME Agency: Belenda Cruise HH Arranged: PT,  OT [facility] Agency: [facility] Date [facility] Agency Contacted: 12/05/22 Time HH Agency Contacted: 1207 Representative spoke with at [facility] Agency: Lorenza Chick   Social Determinants of Health (SDOH) Interventions SDOH Screenings   Tobacco Use: Medium Risk (12/04/2022)    Readmission Risk Interventions     No data to display         Quintella Baton, RN, BSN  Trauma/Neuro ICU Case Manager [phone]
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
## 2022-12-05 NOTE — Progress Notes (Signed)
Inpatient Rehab Admissions Coordinator:  Saw pt at bedside. Discussed that since she is currently at supervision level for bed mobility, transfers, and wheelchair mobility she no longer warrants the intensity of CIR. Discussed other rehab options (HH versus OP) would be appropriate. Pt in agreement. AC will sign off.   Wolfgang Phoenix, MS, CCC-SLP Admissions Coordinator [phone]

## 2022-12-05 NOTE — Progress Notes (Signed)
Discharge instructions given. Patient verbalized understanding and all questions were answered. Medication has been delivered to the patient. Patient ride will be here around 6pm.

## 2022-12-05 NOTE — Discharge Instructions (Signed)
Follow up with orthopedic surgery in 2 weeks Weightbearing: you are non weight bearing left leg 4-6 weeks and non weightbearing right leg 8-10 weeks ROM: Okay for unrestricted hip and knee motion as tolerated Incisional and dressing care: Incisions may be left open to air. Showering: Okay to shower and get incisions wet

## 2022-12-08 ENCOUNTER — Encounter (HOSPITAL_COMMUNITY): Payer: Self-pay | Admitting: Student

## 2022-12-10 ENCOUNTER — Encounter (HOSPITAL_COMMUNITY): Payer: Self-pay | Admitting: Student

## 2022-12-23 DIAGNOSIS — M25562 Pain in left knee: Secondary | ICD-10-CM | POA: Diagnosis not present

## 2022-12-23 DIAGNOSIS — S3282XD Multiple fractures of pelvis without disruption of pelvic ring, subsequent encounter for fracture with routine healing: Secondary | ICD-10-CM | POA: Diagnosis not present

## 2022-12-24 ENCOUNTER — Encounter: Payer: Self-pay | Admitting: Adult Health

## 2022-12-24 ENCOUNTER — Other Ambulatory Visit: Payer: Self-pay | Admitting: Student

## 2022-12-24 ENCOUNTER — Ambulatory Visit (INDEPENDENT_AMBULATORY_CARE_PROVIDER_SITE_OTHER): Payer: BC Managed Care – PPO | Admitting: Adult Health

## 2022-12-24 VITALS — BP 102/57 | HR 73 | Ht 63.0 in | Wt 112.5 lb

## 2022-12-24 DIAGNOSIS — M25462 Effusion, left knee: Secondary | ICD-10-CM

## 2022-12-24 DIAGNOSIS — Z8781 Personal history of (healed) traumatic fracture: Secondary | ICD-10-CM | POA: Insufficient documentation
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
## 2023-01-05 DIAGNOSIS — S329XXA Fracture of unspecified parts of lumbosacral spine and pelvis, initial encounter for closed fracture: Secondary | ICD-10-CM | POA: Diagnosis not present

## 2023-01-05 DIAGNOSIS — Z7289 Other problems related to lifestyle: Secondary | ICD-10-CM | POA: Diagnosis not present

## 2023-01-11 ENCOUNTER — Ambulatory Visit
Admission: RE | Admit: 2023-01-11 | Discharge: 2023-01-11 | Disposition: A | Discharge status: Home / Self Care | Payer: BC Managed Care – PPO | Source: Ambulatory Visit | Attending: Student | Admitting: Student

## 2023-01-11 DIAGNOSIS — S83512A Sprain of anterior cruciate ligament of left knee, initial encounter: Secondary | ICD-10-CM | POA: Diagnosis not present

## 2023-01-11 DIAGNOSIS — M25562 Pain in left knee: Secondary | ICD-10-CM

## 2023-01-13 DIAGNOSIS — S3282XD Multiple fractures of pelvis without disruption of pelvic ring, subsequent encounter for fracture with routine healing: Secondary | ICD-10-CM | POA: Diagnosis not present

## 2023-01-29 DIAGNOSIS — S83512A Sprain of anterior cruciate ligament of left knee, initial encounter: Secondary | ICD-10-CM | POA: Diagnosis not present

## 2023-02-03 DIAGNOSIS — S3282XD Multiple fractures of pelvis without disruption of pelvic ring, subsequent encounter for fracture with routine healing: Secondary | ICD-10-CM | POA: Diagnosis not present

## 2023-02-16 DIAGNOSIS — S83262A Peripheral tear of lateral meniscus, current injury, left knee, initial encounter: Secondary | ICD-10-CM | POA: Diagnosis not present

## 2023-02-16 DIAGNOSIS — S83282A Other tear of lateral meniscus, current injury, left knee, initial encounter: Secondary | ICD-10-CM | POA: Diagnosis not present

## 2023-02-16 DIAGNOSIS — G8918 Other acute postprocedural pain: Secondary | ICD-10-CM | POA: Diagnosis not present

## 2023-02-16 DIAGNOSIS — X58XXXA Exposure to other specified factors, initial encounter: Secondary | ICD-10-CM | POA: Diagnosis not present

## 2023-02-16 DIAGNOSIS — S83512A Sprain of anterior cruciate ligament of left knee, initial encounter: Secondary | ICD-10-CM | POA: Diagnosis not present

## 2023-02-16 DIAGNOSIS — X501XXA Overexertion from prolonged static or awkward postures, initial encounter: Secondary | ICD-10-CM | POA: Diagnosis not present

## 2023-02-16 DIAGNOSIS — S83412A Sprain of medial collateral ligament of left knee, initial encounter: Secondary | ICD-10-CM | POA: Diagnosis not present

## 2023-02-20 ENCOUNTER — Ambulatory Visit: Payer: BC Managed Care – PPO | Attending: Orthopaedic Surgery | Admitting: Physical Therapy

## 2023-02-20 ENCOUNTER — Other Ambulatory Visit: Payer: Self-pay

## 2023-02-20 DIAGNOSIS — R600 Localized edema: Secondary | ICD-10-CM | POA: Insufficient documentation

## 2023-02-20 DIAGNOSIS — M25662 Stiffness of left knee, not elsewhere classified: Secondary | ICD-10-CM | POA: Insufficient documentation

## 2023-02-20 DIAGNOSIS — M6281 Muscle weakness (generalized): Secondary | ICD-10-CM | POA: Diagnosis not present

## 2023-02-20 DIAGNOSIS — M25562 Pain in left knee: Secondary | ICD-10-CM | POA: Insufficient documentation
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
## 2023-02-23 ENCOUNTER — Ambulatory Visit: Payer: BC Managed Care – PPO | Admitting: Physical Therapy

## 2023-02-24 DIAGNOSIS — M25552 Pain in left hip: Secondary | ICD-10-CM | POA: Diagnosis not present

## 2023-02-24 DIAGNOSIS — S83282D Other tear of lateral meniscus, current injury, left knee, subsequent encounter: Secondary | ICD-10-CM | POA: Diagnosis not present

## 2023-02-26 ENCOUNTER — Encounter: Payer: Self-pay | Admitting: *Deleted

## 2023-02-26 ENCOUNTER — Ambulatory Visit: Payer: BC Managed Care – PPO | Admitting: *Deleted

## 2023-02-26 DIAGNOSIS — R600 Localized edema: Secondary | ICD-10-CM | POA: Diagnosis not present

## 2023-02-26 DIAGNOSIS — M6281 Muscle weakness (generalized): Secondary | ICD-10-CM | POA: Diagnosis not present

## 2023-02-26 DIAGNOSIS — M25562 Pain in left knee: Secondary | ICD-10-CM

## 2023-02-26 DIAGNOSIS — M25662 Stiffness of left knee, not elsewhere classified: Secondary | ICD-10-CM | POA: Diagnosis not present
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
## 2023-03-02 ENCOUNTER — Ambulatory Visit: Payer: BC Managed Care – PPO | Admitting: Physical Therapy

## 2023-03-03 ENCOUNTER — Ambulatory Visit: Payer: BC Managed Care – PPO | Attending: Orthopaedic Surgery | Admitting: *Deleted

## 2023-03-03 DIAGNOSIS — M6281 Muscle weakness (generalized): Secondary | ICD-10-CM | POA: Diagnosis not present

## 2023-03-03 DIAGNOSIS — R600 Localized edema: Secondary | ICD-10-CM | POA: Diagnosis not present

## 2023-03-03 DIAGNOSIS — M25662 Stiffness of left knee, not elsewhere classified: Secondary | ICD-10-CM | POA: Diagnosis not present

## 2023-03-03 DIAGNOSIS — M25562 Pain in left knee: Secondary | ICD-10-CM | POA: Insufficient documentation
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
## 2023-03-06 ENCOUNTER — Ambulatory Visit: Payer: BC Managed Care – PPO | Admitting: *Deleted

## 2023-03-06 DIAGNOSIS — M6281 Muscle weakness (generalized): Secondary | ICD-10-CM

## 2023-03-06 DIAGNOSIS — M25662 Stiffness of left knee, not elsewhere classified: Secondary | ICD-10-CM

## 2023-03-06 DIAGNOSIS — R600 Localized edema: Secondary | ICD-10-CM | POA: Diagnosis not present

## 2023-03-06 DIAGNOSIS — M25562 Pain in left knee: Secondary | ICD-10-CM | POA: Diagnosis not present
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
## 2023-03-09 ENCOUNTER — Encounter: Payer: Self-pay | Admitting: Physical Therapy

## 2023-03-09 ENCOUNTER — Ambulatory Visit: Payer: BC Managed Care – PPO | Admitting: Physical Therapy

## 2023-03-09 DIAGNOSIS — R600 Localized edema: Secondary | ICD-10-CM

## 2023-03-09 DIAGNOSIS — M6281 Muscle weakness (generalized): Secondary | ICD-10-CM | POA: Diagnosis not present

## 2023-03-09 DIAGNOSIS — M25562 Pain in left knee: Secondary | ICD-10-CM

## 2023-03-09 DIAGNOSIS — M25662 Stiffness of left knee, not elsewhere classified: Secondary | ICD-10-CM
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
## 2023-03-10 DIAGNOSIS — S3282XD Multiple fractures of pelvis without disruption of pelvic ring, subsequent encounter for fracture with routine healing: Secondary | ICD-10-CM | POA: Diagnosis not present

## 2023-03-13 ENCOUNTER — Encounter: Payer: Self-pay | Admitting: *Deleted

## 2023-03-13 ENCOUNTER — Ambulatory Visit: Payer: BC Managed Care – PPO | Admitting: *Deleted

## 2023-03-13 DIAGNOSIS — M25662 Stiffness of left knee, not elsewhere classified: Secondary | ICD-10-CM | POA: Diagnosis not present

## 2023-03-13 DIAGNOSIS — R600 Localized edema: Secondary | ICD-10-CM

## 2023-03-13 DIAGNOSIS — M25562 Pain in left knee: Secondary | ICD-10-CM | POA: Diagnosis not present

## 2023-03-13 DIAGNOSIS — M6281 Muscle weakness (generalized): Secondary | ICD-10-CM | POA: Diagnosis not present
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
## 2023-03-16 ENCOUNTER — Ambulatory Visit: Payer: BC Managed Care – PPO | Admitting: Physical Therapy

## 2023-03-16 DIAGNOSIS — M6281 Muscle weakness (generalized): Secondary | ICD-10-CM | POA: Diagnosis not present

## 2023-03-16 DIAGNOSIS — R600 Localized edema: Secondary | ICD-10-CM

## 2023-03-16 DIAGNOSIS — M25662 Stiffness of left knee, not elsewhere classified: Secondary | ICD-10-CM

## 2023-03-16 DIAGNOSIS — M25562 Pain in left knee: Secondary | ICD-10-CM
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
## 2023-03-20 ENCOUNTER — Ambulatory Visit: Payer: BC Managed Care – PPO | Admitting: *Deleted

## 2023-03-20 DIAGNOSIS — M6281 Muscle weakness (generalized): Secondary | ICD-10-CM

## 2023-03-20 DIAGNOSIS — M25562 Pain in left knee: Secondary | ICD-10-CM | POA: Diagnosis not present

## 2023-03-20 DIAGNOSIS — M25662 Stiffness of left knee, not elsewhere classified: Secondary | ICD-10-CM

## 2023-03-20 DIAGNOSIS — R600 Localized edema: Secondary | ICD-10-CM | POA: Diagnosis not present
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
## 2023-03-23 ENCOUNTER — Ambulatory Visit: Payer: BC Managed Care – PPO | Admitting: *Deleted

## 2023-03-24 ENCOUNTER — Ambulatory Visit: Payer: BC Managed Care – PPO | Admitting: *Deleted

## 2023-03-24 ENCOUNTER — Encounter: Payer: Self-pay | Admitting: *Deleted

## 2023-03-24 DIAGNOSIS — M6281 Muscle weakness (generalized): Secondary | ICD-10-CM

## 2023-03-24 DIAGNOSIS — M25662 Stiffness of left knee, not elsewhere classified: Secondary | ICD-10-CM | POA: Diagnosis not present

## 2023-03-24 DIAGNOSIS — M25562 Pain in left knee: Secondary | ICD-10-CM | POA: Diagnosis not present

## 2023-03-24 DIAGNOSIS — R600 Localized edema: Secondary | ICD-10-CM
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
## 2023-03-26 ENCOUNTER — Ambulatory Visit: Payer: BC Managed Care – PPO | Admitting: *Deleted

## 2023-03-26 ENCOUNTER — Encounter: Payer: Self-pay | Admitting: *Deleted

## 2023-03-26 DIAGNOSIS — M6281 Muscle weakness (generalized): Secondary | ICD-10-CM | POA: Diagnosis not present

## 2023-03-26 DIAGNOSIS — M25562 Pain in left knee: Secondary | ICD-10-CM

## 2023-03-26 DIAGNOSIS — M25662 Stiffness of left knee, not elsewhere classified: Secondary | ICD-10-CM

## 2023-03-26 DIAGNOSIS — R600 Localized edema: Secondary | ICD-10-CM
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
## 2023-03-31 ENCOUNTER — Ambulatory Visit: Payer: BC Managed Care – PPO | Admitting: *Deleted

## 2023-03-31 ENCOUNTER — Encounter: Payer: Self-pay | Admitting: *Deleted

## 2023-03-31 DIAGNOSIS — R600 Localized edema: Secondary | ICD-10-CM

## 2023-03-31 DIAGNOSIS — M6281 Muscle weakness (generalized): Secondary | ICD-10-CM

## 2023-03-31 DIAGNOSIS — M25662 Stiffness of left knee, not elsewhere classified: Secondary | ICD-10-CM | POA: Diagnosis not present

## 2023-03-31 DIAGNOSIS — M25562 Pain in left knee: Secondary | ICD-10-CM
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
## 2023-04-02 DIAGNOSIS — M25562 Pain in left knee: Secondary | ICD-10-CM | POA: Diagnosis not present

## 2023-04-03 ENCOUNTER — Ambulatory Visit: Payer: BC Managed Care – PPO | Attending: Orthopaedic Surgery | Admitting: *Deleted

## 2023-04-03 DIAGNOSIS — M25662 Stiffness of left knee, not elsewhere classified: Secondary | ICD-10-CM | POA: Insufficient documentation

## 2023-04-03 DIAGNOSIS — R600 Localized edema: Secondary | ICD-10-CM | POA: Diagnosis not present

## 2023-04-03 DIAGNOSIS — M6281 Muscle weakness (generalized): Secondary | ICD-10-CM | POA: Diagnosis not present

## 2023-04-03 DIAGNOSIS — M25562 Pain in left knee: Secondary | ICD-10-CM | POA: Insufficient documentation
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
## 2023-04-06 ENCOUNTER — Ambulatory Visit: Payer: BC Managed Care – PPO | Admitting: Physical Therapy

## 2023-04-08 ENCOUNTER — Ambulatory Visit: Payer: BC Managed Care – PPO | Admitting: Physical Therapy

## 2023-04-08 DIAGNOSIS — M25662 Stiffness of left knee, not elsewhere classified: Secondary | ICD-10-CM

## 2023-04-08 DIAGNOSIS — M25562 Pain in left knee: Secondary | ICD-10-CM

## 2023-04-08 DIAGNOSIS — R600 Localized edema: Secondary | ICD-10-CM

## 2023-04-08 DIAGNOSIS — M6281 Muscle weakness (generalized): Secondary | ICD-10-CM | POA: Diagnosis not present
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
## 2023-04-09 ENCOUNTER — Ambulatory Visit: Payer: BC Managed Care – PPO | Admitting: Physical Therapy

## 2023-04-09 DIAGNOSIS — M25562 Pain in left knee: Secondary | ICD-10-CM | POA: Diagnosis not present

## 2023-04-09 DIAGNOSIS — R600 Localized edema: Secondary | ICD-10-CM | POA: Diagnosis not present

## 2023-04-09 DIAGNOSIS — M6281 Muscle weakness (generalized): Secondary | ICD-10-CM | POA: Diagnosis not present

## 2023-04-09 DIAGNOSIS — M25662 Stiffness of left knee, not elsewhere classified: Secondary | ICD-10-CM
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
## 2023-04-14 ENCOUNTER — Ambulatory Visit: Payer: BC Managed Care – PPO | Admitting: *Deleted

## 2023-04-14 ENCOUNTER — Encounter: Payer: Self-pay | Admitting: *Deleted

## 2023-04-14 DIAGNOSIS — R600 Localized edema: Secondary | ICD-10-CM

## 2023-04-14 DIAGNOSIS — M6281 Muscle weakness (generalized): Secondary | ICD-10-CM

## 2023-04-14 DIAGNOSIS — M25662 Stiffness of left knee, not elsewhere classified: Secondary | ICD-10-CM | POA: Diagnosis not present

## 2023-04-14 DIAGNOSIS — M25562 Pain in left knee: Secondary | ICD-10-CM
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
## 2023-04-16 ENCOUNTER — Ambulatory Visit: Payer: BC Managed Care – PPO | Admitting: *Deleted

## 2023-04-21 ENCOUNTER — Ambulatory Visit: Payer: BC Managed Care – PPO | Admitting: *Deleted

## 2023-04-21 DIAGNOSIS — M25662 Stiffness of left knee, not elsewhere classified: Secondary | ICD-10-CM | POA: Diagnosis not present

## 2023-04-21 DIAGNOSIS — M25562 Pain in left knee: Secondary | ICD-10-CM | POA: Diagnosis not present

## 2023-04-21 DIAGNOSIS — M6281 Muscle weakness (generalized): Secondary | ICD-10-CM | POA: Diagnosis not present

## 2023-04-21 DIAGNOSIS — R600 Localized edema: Secondary | ICD-10-CM | POA: Diagnosis not present
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
## 2023-04-21 NOTE — Addendum Note (Signed)
Addended by: Garry Nicolini, [place] W on: 04/21/2023 03:54 PM   Modules accepted: Orders

## 2023-04-23 ENCOUNTER — Ambulatory Visit: Payer: BC Managed Care – PPO | Admitting: *Deleted

## 2023-04-23 DIAGNOSIS — R600 Localized edema: Secondary | ICD-10-CM

## 2023-04-23 DIAGNOSIS — M25662 Stiffness of left knee, not elsewhere classified: Secondary | ICD-10-CM

## 2023-04-23 DIAGNOSIS — M6281 Muscle weakness (generalized): Secondary | ICD-10-CM | POA: Diagnosis not present

## 2023-04-23 DIAGNOSIS — M25562 Pain in left knee: Secondary | ICD-10-CM | POA: Diagnosis not present
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
## 2023-04-28 ENCOUNTER — Ambulatory Visit: Payer: BC Managed Care – PPO | Admitting: *Deleted

## 2023-04-28 DIAGNOSIS — M25662 Stiffness of left knee, not elsewhere classified: Secondary | ICD-10-CM

## 2023-04-28 DIAGNOSIS — R600 Localized edema: Secondary | ICD-10-CM

## 2023-04-28 DIAGNOSIS — M25562 Pain in left knee: Secondary | ICD-10-CM

## 2023-04-28 DIAGNOSIS — M6281 Muscle weakness (generalized): Secondary | ICD-10-CM
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
## 2023-04-30 ENCOUNTER — Ambulatory Visit: Payer: BC Managed Care – PPO | Admitting: *Deleted

## 2023-04-30 DIAGNOSIS — M25562 Pain in left knee: Secondary | ICD-10-CM | POA: Diagnosis not present

## 2023-04-30 DIAGNOSIS — M25662 Stiffness of left knee, not elsewhere classified: Secondary | ICD-10-CM | POA: Diagnosis not present

## 2023-04-30 DIAGNOSIS — M6281 Muscle weakness (generalized): Secondary | ICD-10-CM

## 2023-04-30 DIAGNOSIS — R600 Localized edema: Secondary | ICD-10-CM
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
## 2023-05-05 DIAGNOSIS — M25562 Pain in left knee: Secondary | ICD-10-CM | POA: Diagnosis not present

## 2023-05-07 ENCOUNTER — Ambulatory Visit: Payer: BC Managed Care – PPO | Admitting: *Deleted

## 2023-05-13 DIAGNOSIS — M25462 Effusion, left knee: Secondary | ICD-10-CM | POA: Diagnosis not present

## 2023-05-13 DIAGNOSIS — R609 Edema, unspecified: Secondary | ICD-10-CM | POA: Diagnosis not present

## 2023-05-13 DIAGNOSIS — M25562 Pain in left knee: Secondary | ICD-10-CM | POA: Diagnosis not present

## 2023-05-18 DIAGNOSIS — M24662 Ankylosis, left knee: Secondary | ICD-10-CM | POA: Diagnosis not present

## 2023-05-18 DIAGNOSIS — Z9889 Other specified postprocedural states: Secondary | ICD-10-CM | POA: Diagnosis not present

## 2023-05-19 ENCOUNTER — Ambulatory Visit: Payer: BC Managed Care – PPO | Attending: Orthopaedic Surgery | Admitting: Physical Therapy

## 2023-05-19 DIAGNOSIS — M6281 Muscle weakness (generalized): Secondary | ICD-10-CM | POA: Insufficient documentation

## 2023-05-19 DIAGNOSIS — R600 Localized edema: Secondary | ICD-10-CM | POA: Insufficient documentation

## 2023-05-19 DIAGNOSIS — M25562 Pain in left knee: Secondary | ICD-10-CM | POA: Insufficient documentation

## 2023-05-19 DIAGNOSIS — M25662 Stiffness of left knee, not elsewhere classified: Secondary | ICD-10-CM | POA: Insufficient documentation

## 2023-05-21 ENCOUNTER — Ambulatory Visit: Payer: BC Managed Care – PPO | Admitting: Physical Therapy

## 2023-05-21 DIAGNOSIS — M6281 Muscle weakness (generalized): Secondary | ICD-10-CM

## 2023-05-21 DIAGNOSIS — M25662 Stiffness of left knee, not elsewhere classified: Secondary | ICD-10-CM

## 2023-05-21 DIAGNOSIS — M25562 Pain in left knee: Secondary | ICD-10-CM | POA: Diagnosis not present

## 2023-05-21 DIAGNOSIS — R600 Localized edema: Secondary | ICD-10-CM | POA: Diagnosis not present
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
## 2023-05-27 ENCOUNTER — Ambulatory Visit: Payer: BC Managed Care – PPO | Admitting: Physical Therapy

## 2023-06-02 ENCOUNTER — Encounter: Payer: BC Managed Care – PPO | Admitting: *Deleted

## 2023-06-04 ENCOUNTER — Encounter: Payer: BC Managed Care – PPO | Admitting: *Deleted

## 2023-08-04 DIAGNOSIS — W278XXA Contact with other nonpowered hand tool, initial encounter: Secondary | ICD-10-CM | POA: Diagnosis not present

## 2023-08-04 DIAGNOSIS — S6000XA Contusion of unspecified finger without damage to nail, initial encounter: Secondary | ICD-10-CM | POA: Diagnosis not present

## 2023-08-04 DIAGNOSIS — M79642 Pain in left hand: Secondary | ICD-10-CM | POA: Diagnosis not present

## 2023-08-04 DIAGNOSIS — S6992XA Unspecified injury of left wrist, hand and finger(s), initial encounter: Secondary | ICD-10-CM | POA: Diagnosis not present

## 2023-08-13 ENCOUNTER — Ambulatory Visit
Admission: EM | Admit: 2023-08-13 | Discharge: 2023-08-13 | Disposition: A | Discharge status: Home / Self Care | Payer: BC Managed Care – PPO | Attending: Family Medicine | Admitting: Family Medicine

## 2023-08-13 ENCOUNTER — Encounter: Payer: Self-pay | Admitting: Emergency Medicine

## 2023-08-13 DIAGNOSIS — R6889 Other general symptoms and signs: Secondary | ICD-10-CM | POA: Diagnosis not present

## 2023-08-13 LAB — POC SARS CORONAVIRUS 2 AG -  ED: SARS Coronavirus 2 Ag: NEGATIVE

## 2023-08-13 LAB — POCT INFLUENZA A/B

## 2023-08-13 LAB — POCT RAPID STREP A (OFFICE)

## 2023-08-13 NOTE — ED Provider Notes (Signed)
[facility]    CSN: [id number] Arrival date & time: 08/13/23  1659      History   Chief Complaint Chief Complaint  Patient presents with   Sore Throat    HPI Stacey Bates is a 22 y.o. female.   Pleasant 22 year old.  Here for headache, body aches, sore throat, cough and congestion.  She has been sick for 2 to 3 days.  She has had some sweats and chills, most recently this morning.  Has not taken her temperature.    Past Medical History:  Diagnosis Date   Ankle sprain    Chlamydia 08/16/2019   Treated 08/16/19 POC neg Jan 2021   Mental disorder    depression   Nexplanon insertion 08/16/2019   Inserted left arm 08/16/2019; removed 09/24/2022    Trichimoniasis 08/16/2019   Treated 08/16/2019 POC neg Jan 2021    Patient Active Problem List   Diagnosis Date Noted   History of pelvic fracture 12/24/2022   Multiple pelvic fractures (HCC) 11/28/2022    Past Surgical History:  Procedure Laterality Date   CYSTOGRAM N/A 11/28/2022   Procedure: CYSTOGRAM;  Surgeon: Jerilee Field, MD;  Location: [facility] OR;  Service: Urology;  Laterality: N/A;   INSERTION OF TRACTION PIN  11/28/2022   Procedure: INSERTION OF TRACTION PIN;  Surgeon: Roby Lofts, MD;  Location: [facility] OR;  Service: Orthopedics;;   ORIF PELVIC FRACTURE Right 11/28/2022   Procedure: OPEN REDUCTION INTERNAL FIXATION (ORIF) PELVIC FRACTURE;  Surgeon: Roby Lofts, MD;  Location: [facility] OR;  Service: Orthopedics;  Laterality: Right;   TONSILLECTOMY AND ADENOIDECTOMY      OB History     Gravida  0   Para  0   Term  0   Preterm  0   AB  0   Living  0      SAB  0   IAB  0   Ectopic  0   Multiple  0   Live Births  0            Home Medications    Prior to Admission medications   Medication Sig Start Date End Date Taking? Authorizing Provider  acetaminophen (TYLENOL) 500 MG tablet Take 2 tablets (1,000 mg total) by mouth every 6 (six) hours as needed for mild pain or  moderate pain. 12/05/22   Eric Form, PA-C  apixaban (ELIQUIS) 2.5 MG TABS tablet Take 1 tablet (2.5 mg total) by mouth 2 (two) times daily. 12/06/22 02/04/23  Eric Form, PA-C  docusate sodium (COLACE) 100 MG capsule Take 1 capsule (100 mg total) by mouth 2 (two) times daily as needed for mild constipation or moderate constipation. 12/05/22   Eric Form, PA-C  MELOXICAM PO Take by mouth.    [provider]  oxyCODONE (OXY IR/ROXICODONE) 5 MG immediate release tablet Take 5 mg by mouth 3 (three) times daily as needed. 12/23/22   [provider]  polyethylene glycol (MIRALAX / GLYCOLAX) 17 g packet Take 17 g by mouth daily as needed for severe constipation or moderate constipation. 12/05/22   Eric Form, PA-C    Family History Family History  Problem Relation Age of Onset   Healthy Mother    Depression Mother    Healthy Father     Social History Social History   Tobacco Use   Smoking status: Every Day    Types: E-cigarettes   Smokeless tobacco: Never  Vaping Use   Vaping status: Every  Day  Substance Use Topics   Alcohol use: No   Drug use: Not Currently    Types: Marijuana     Allergies   Cefdinir, Cephalosporins, and Sulfa antibiotics   Review of Systems Review of Systems  See HPI Physical Exam Triage Vital Signs ED Triage Vitals  Encounter Vitals Group     BP 08/13/23 1711 118/76     Systolic BP Percentile --      Diastolic BP Percentile --      Pulse Rate 08/13/23 1711 73     Resp 08/13/23 1711 18     Temp 08/13/23 1711 98.4 F (36.9 C)     Temp Source 08/13/23 1711 Oral     SpO2 08/13/23 1711 100 %     Weight 08/13/23 1713 103 lb (46.7 kg)     Height 08/13/23 1713 5\' 3"  (1.6 m)     Head Circumference --      Peak Flow --      Pain Score 08/13/23 1712 3     Pain Loc --      Pain Education --      Exclude from Growth Chart --    No data found.  Updated Vital Signs BP 118/76 (BP Location: Right Arm)   Pulse 73    Temp 98.4 F (36.9 C) (Oral)   Resp 18   Ht 5\' 3"  (1.6 m)   Wt 46.7 kg   LMP 08/06/2023   SpO2 100%   BMI 18.25 kg/m       Physical Exam Constitutional:      General: She is not in acute distress.    Appearance: She is well-developed and normal weight.  HENT:     Head: Normocephalic and atraumatic.     Right Ear: Tympanic membrane normal.     Left Ear: Tympanic membrane normal.     Nose: No congestion.     Mouth/Throat:     Pharynx: No posterior oropharyngeal erythema.  Eyes:     Conjunctiva/sclera: Conjunctivae normal.     Pupils: Pupils are equal, round, and reactive to light.  Cardiovascular:     Rate and Rhythm: Normal rate.  Pulmonary:     Effort: Pulmonary effort is normal. No respiratory distress.     Breath sounds: Normal breath sounds.  Abdominal:     General: There is no distension.     Palpations: Abdomen is soft.  Musculoskeletal:        General: Normal range of motion.     Cervical back: Normal range of motion.  Skin:    General: Skin is warm and dry.  Neurological:     Mental Status: She is alert.      UC Treatments / Results  Labs (all labs ordered are listed, but only abnormal results are displayed) Labs Reviewed  POCT INFLUENZA A/B - Normal  POCT RAPID STREP A (OFFICE) - Normal  POC SARS CORONAVIRUS 2 AG -  ED    EKG   Radiology No results found.  Procedures Procedures (including critical care time)  Medications Ordered in UC Medications - No data to display  Initial Impression / Assessment and Plan / UC Course  I have reviewed the triage vital signs and the nursing notes.  Pertinent labs & imaging results that were available during my care of the patient were reviewed by me and considered in my medical decision making (see chart for details).     Final Clinical Impressions(s) / UC Diagnoses   Final diagnoses:  Flu-like symptoms     Discharge Instructions      May take over-the-counter cough or cold medicines as  needed Drink lots of fluids Call if not improving by next week    ED Prescriptions   None    PDMP not reviewed this encounter.   Stacey Moore, MD 08/13/23 [phone]

## 2023-08-13 NOTE — Discharge Instructions (Signed)
May take over-the-counter cough or cold medicines as needed Drink lots of fluids Call if not improving by next week

## 2023-08-13 NOTE — ED Notes (Signed)
All tests were negative.

## 2023-08-13 NOTE — ED Triage Notes (Signed)
Patient c/o headache, sore throat, congestion, body chills x 3 days.  Afebrile.  Patient has taken Ibuprofen and Nyquil.

## 2023-08-21 DIAGNOSIS — H6503 Acute serous otitis media, bilateral: Secondary | ICD-10-CM | POA: Diagnosis not present

## 2023-10-02 DIAGNOSIS — J09X2 Influenza due to identified novel influenza A virus with other respiratory manifestations: Secondary | ICD-10-CM | POA: Diagnosis not present

## 2023-10-02 DIAGNOSIS — R509 Fever, unspecified: Secondary | ICD-10-CM | POA: Diagnosis not present

## 2023-10-02 DIAGNOSIS — Z681 Body mass index (BMI) 19 or less, adult: Secondary | ICD-10-CM | POA: Diagnosis not present

## 2023-10-05 DIAGNOSIS — S3282XD Multiple fractures of pelvis without disruption of pelvic ring, subsequent encounter for fracture with routine healing: Secondary | ICD-10-CM | POA: Diagnosis not present

## 2023-10-15 ENCOUNTER — Ambulatory Visit: Payer: Self-pay | Admitting: Student

## 2023-11-06 ENCOUNTER — Ambulatory Visit: Admitting: *Deleted

## 2023-11-06 ENCOUNTER — Other Ambulatory Visit (HOSPITAL_COMMUNITY)
Admission: RE | Admit: 2023-11-06 | Discharge: 2023-11-06 | Disposition: A | Discharge status: Home / Self Care | Source: Ambulatory Visit | Attending: Obstetrics & Gynecology | Admitting: Obstetrics & Gynecology

## 2023-11-06 DIAGNOSIS — N898 Other specified noninflammatory disorders of vagina: Secondary | ICD-10-CM | POA: Insufficient documentation

## 2023-11-06 NOTE — Progress Notes (Signed)
   NURSE VISIT- VAGINITIS  SUBJECTIVE:  Stacey Bates is a 23 y.o. G0P0000 GYN patientfemale here for a vaginal swab for vaginitis screening.  She reports the following symptoms: odor for 2 weeks. Denies abnormal vaginal bleeding, significant pelvic pain, fever, or UTI symptoms.  OBJECTIVE:  There were no vitals taken for this visit.  Appears well, in no apparent distress  ASSESSMENT: Vaginal swab for vaginitis screening  PLAN: Self-collected vaginal probe for Gonorrhea, Chlamydia, Trichomonas, Bacterial Vaginosis, Yeast sent to lab Treatment: to be determined once results are received Follow-up as needed if symptoms persist/worsen, or new symptoms develop  Jobe Marker  11/06/2023 10:13 AM

## 2023-11-09 LAB — CERVICOVAGINAL ANCILLARY ONLY
Bacterial Vaginitis (gardnerella): POSITIVE — AB
Candida Glabrata: NEGATIVE
Candida Vaginitis: NEGATIVE
Chlamydia: NEGATIVE
Comment: NEGATIVE
Comment: NEGATIVE
Comment: NEGATIVE
Comment: NEGATIVE
Comment: NEGATIVE
Comment: NORMAL
Neisseria Gonorrhea: NEGATIVE
Trichomonas: NEGATIVE
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
## 2023-11-09 NOTE — Progress Notes (Signed)
 PCP - [facility] -   PPM/ICD - Denies Device Orders - n/a Rep Notified - n/a  Chest x-ray - denies EKG - denies Stress Test - denies ECHO - denies Cardiac Cath - denies  CPAP - denies  DM -denies  Blood Thinner Instructions: Denies Aspirin Instructions: n/a  ERAS Protcol - NPO  COVID TEST- n/a  Anesthesia review: no  Patient verbally denies any shortness of breath, fever, cough and chest pain during phone call   -------------  SDW INSTRUCTIONS given:  Your procedure is scheduled on November 11, 2023.  Report to [facility] Main Entrance "A" at 6:00 A.M., and check in at the Admitting office.  Call this number if you have problems the morning of surgery:  [phone]   Remember:  Do not eat or drink after midnight the night before your surgery      Take these medicines the morning of surgery with A SIP OF WATER  acetaminophen (TYLENOL)   As of today, STOP taking any Aspirin (unless otherwise instructed by your surgeon) Aleve, Naproxen, Ibuprofen, Motrin, Advil, Goody's, BC's, all herbal medications, fish oil, and all vitamins.                      Do not wear jewelry, make up, or nail polish            Do not wear lotions, powders, perfumes/colognes, or deodorant.            Do not shave 48 hours prior to surgery.  Men may shave face and neck.            Do not bring valuables to the hospital.            [facility] is not responsible for any belongings or valuables.  Do NOT Smoke (Tobacco/Vaping) 24 hours prior to your procedure If you use a CPAP at night, you may bring all equipment for your overnight stay.   Contacts, glasses, dentures or bridgework may not be worn into surgery.      For patients admitted to the hospital, discharge time will be determined by your treatment team.   Patients discharged the day of surgery will not be allowed to drive home, and someone needs to stay with them for 24 hours.    Special  instructions:   [place]- Preparing For Surgery  Before surgery, you can play an important role. Because skin is not sterile, your skin needs to be as free of germs as possible. You can reduce the number of germs on your skin by washing with CHG (chlorahexidine gluconate) Soap before surgery.  CHG is an antiseptic cleaner which kills germs and bonds with the skin to continue killing germs even after washing.    Oral Hygiene is also important to reduce your risk of infection.  Remember - BRUSH YOUR TEETH THE MORNING OF SURGERY WITH YOUR REGULAR TOOTHPASTE  Please do not use if you have an allergy to CHG or antibacterial soaps. If your skin becomes reddened/irritated stop using the CHG.  Do not shave (including legs and underarms) for at least 48 hours prior to first CHG shower. It is OK to shave your face.  Please follow these instructions carefully.   Shower the NIGHT BEFORE SURGERY and the MORNING OF SURGERY with DIAL Soap.   Pat yourself dry with a CLEAN TOWEL.  Wear CLEAN PAJAMAS to bed the night before surgery  Place CLEAN SHEETS on  your bed the night of your first shower and DO NOT SLEEP WITH PETS.   Day of Surgery: Please shower morning of surgery  Wear Clean/Comfortable clothing the morning of surgery Do not apply any deodorants/lotions.   Remember to brush your teeth WITH YOUR REGULAR TOOTHPASTE.   Questions were answered. Patient verbalized understanding of instructions.

## 2023-11-10 ENCOUNTER — Encounter (HOSPITAL_COMMUNITY): Payer: Self-pay | Admitting: Student

## 2023-11-10 ENCOUNTER — Other Ambulatory Visit: Payer: Self-pay

## 2023-11-10 ENCOUNTER — Telehealth: Payer: Self-pay

## 2023-11-10 NOTE — Telephone Encounter (Signed)
Patient would like to speak with someone about her test results °

## 2023-11-10 NOTE — Telephone Encounter (Signed)
 Pt responded to via [employer]

## 2023-11-11 ENCOUNTER — Ambulatory Visit (HOSPITAL_COMMUNITY): Admitting: Anesthesiology

## 2023-11-11 ENCOUNTER — Encounter (HOSPITAL_COMMUNITY)
Admission: RE | Disposition: A | Discharge status: Home / Self Care | Payer: Self-pay | Source: Home / Self Care | Attending: Student

## 2023-11-11 ENCOUNTER — Other Ambulatory Visit: Payer: Self-pay

## 2023-11-11 ENCOUNTER — Ambulatory Visit (HOSPITAL_COMMUNITY)

## 2023-11-11 ENCOUNTER — Encounter (HOSPITAL_COMMUNITY): Payer: Self-pay | Admitting: Student

## 2023-11-11 ENCOUNTER — Ambulatory Visit (HOSPITAL_COMMUNITY)
Admission: RE | Admit: 2023-11-11 | Discharge: 2023-11-11 | Disposition: A | Discharge status: Home / Self Care | Payer: BC Managed Care – PPO | Attending: Student | Admitting: Student

## 2023-11-11 ENCOUNTER — Other Ambulatory Visit: Payer: Self-pay | Admitting: Adult Health

## 2023-11-11 DIAGNOSIS — S3282XA Multiple fractures of pelvis without disruption of pelvic ring, initial encounter for closed fracture: Secondary | ICD-10-CM | POA: Diagnosis not present

## 2023-11-11 DIAGNOSIS — Z472 Encounter for removal of internal fixation device: Secondary | ICD-10-CM | POA: Diagnosis not present

## 2023-11-11 DIAGNOSIS — T8484XA Pain due to internal orthopedic prosthetic devices, implants and grafts, initial encounter: Secondary | ICD-10-CM | POA: Insufficient documentation

## 2023-11-11 DIAGNOSIS — F17290 Nicotine dependence, other tobacco product, uncomplicated: Secondary | ICD-10-CM | POA: Insufficient documentation

## 2023-11-11 DIAGNOSIS — T8484XS Pain due to internal orthopedic prosthetic devices, implants and grafts, sequela: Secondary | ICD-10-CM | POA: Insufficient documentation

## 2023-11-11 HISTORY — PX: HARDWARE REMOVAL: SHX979

## 2023-11-11 LAB — CBC
HCT: 36.2 % (ref 36.0–46.0)
Hemoglobin: 11.6 g/dL — ABNORMAL LOW (ref 12.0–15.0)
MCH: 27.4 pg (ref 26.0–34.0)
MCHC: 32 g/dL (ref 30.0–36.0)
MCV: 85.6 fL (ref 80.0–100.0)
Platelets: 334 10*3/uL (ref 150–400)
RBC: 4.23 MIL/uL (ref 3.87–5.11)
RDW: 15 % (ref 11.5–15.5)
WBC: 7.1 10*3/uL (ref 4.0–10.5)
nRBC: 0 % (ref 0.0–0.2)

## 2023-11-11 LAB — POCT PREGNANCY, URINE: Preg Test, Ur: NEGATIVE

## 2023-11-11 SURGERY — REMOVAL, HARDWARE
Anesthesia: General | Laterality: Right

## 2023-11-11 MED ORDER — ROCURONIUM BROMIDE 100 MG/10ML IV SOLN
INTRAVENOUS | Status: DC | PRN
  Administered 2023-11-11: 50 mg via INTRAVENOUS

## 2023-11-11 MED ORDER — ACETAMINOPHEN 10 MG/ML IV SOLN
INTRAVENOUS | Status: AC
  Filled 2023-11-11: qty 100

## 2023-11-11 MED ORDER — LIDOCAINE HCL (CARDIAC) PF 100 MG/5ML IV SOSY
PREFILLED_SYRINGE | INTRAVENOUS | Status: DC | PRN
  Administered 2023-11-11: 100 mg via INTRATRACHEAL

## 2023-11-11 MED ORDER — LEVOFLOXACIN IN D5W 500 MG/100ML IV SOLN
500.0000 mg | INTRAVENOUS | Status: AC
Start: 2023-11-11 — End: 2023-11-11
  Administered 2023-11-11: 500 mg via INTRAVENOUS
  Filled 2023-11-11: qty 100

## 2023-11-11 MED ORDER — SUGAMMADEX SODIUM 200 MG/2ML IV SOLN
INTRAVENOUS | Status: DC | PRN
Start: 2023-11-11 — End: 2023-11-11
  Administered 2023-11-11: 200 mg via INTRAVENOUS

## 2023-11-11 MED ORDER — ACETAMINOPHEN 10 MG/ML IV SOLN
1000.0000 mg | Freq: Once | INTRAVENOUS | Status: DC | PRN
  Administered 2023-11-11: 1000 mg via INTRAVENOUS

## 2023-11-11 MED ORDER — OXYCODONE HCL 5 MG PO TABS: 5.0000 mg | ORAL_TABLET | Freq: Once | ORAL | Status: DC | PRN

## 2023-11-11 MED ORDER — ONDANSETRON HCL 4 MG/2ML IJ SOLN
INTRAMUSCULAR | Status: DC | PRN
  Administered 2023-11-11: 4 mg via INTRAVENOUS

## 2023-11-11 MED ORDER — METRONIDAZOLE 500 MG PO TABS: 500.0000 mg | ORAL_TABLET | Freq: Two times a day (BID) | ORAL | 0 refills | Status: DC

## 2023-11-11 MED ORDER — HYDROMORPHONE HCL 1 MG/ML IJ SOLN
INTRAMUSCULAR | Status: AC
  Filled 2023-11-11: qty 1

## 2023-11-11 MED ORDER — VANCOMYCIN HCL IN DEXTROSE 1-5 GM/200ML-% IV SOLN
1000.0000 mg | INTRAVENOUS | Status: AC
  Administered 2023-11-11: 1000 mg via INTRAVENOUS
  Filled 2023-11-11: qty 200

## 2023-11-11 MED ORDER — 0.9 % SODIUM CHLORIDE (POUR BTL) OPTIME
TOPICAL | Status: DC | PRN
  Administered 2023-11-11: 1000 mL

## 2023-11-11 MED ORDER — ONDANSETRON HCL 4 MG/2ML IJ SOLN: 4.0000 mg | Freq: Once | INTRAMUSCULAR | Status: DC | PRN

## 2023-11-11 MED ORDER — ROCURONIUM BROMIDE 10 MG/ML (PF) SYRINGE
PREFILLED_SYRINGE | INTRAVENOUS | Status: AC
  Filled 2023-11-11: qty 10

## 2023-11-11 MED ORDER — KETOROLAC TROMETHAMINE 30 MG/ML IJ SOLN
INTRAMUSCULAR | Status: DC | PRN
Start: 2023-11-11 — End: 2023-11-11
  Administered 2023-11-11: 30 mg via INTRAVENOUS

## 2023-11-11 MED ORDER — FENTANYL CITRATE (PF) 250 MCG/5ML IJ SOLN
INTRAMUSCULAR | Status: DC | PRN
  Administered 2023-11-11: 100 ug via INTRAVENOUS

## 2023-11-11 MED ORDER — DEXAMETHASONE SODIUM PHOSPHATE 10 MG/ML IJ SOLN
INTRAMUSCULAR | Status: AC
  Filled 2023-11-11: qty 1

## 2023-11-11 MED ORDER — PROPOFOL 10 MG/ML IV BOLUS
INTRAVENOUS | Status: DC | PRN
  Administered 2023-11-11: 150 mg via INTRAVENOUS

## 2023-11-11 MED ORDER — ONDANSETRON HCL 4 MG/2ML IJ SOLN
INTRAMUSCULAR | Status: AC
  Filled 2023-11-11: qty 2

## 2023-11-11 MED ORDER — MIDAZOLAM HCL 2 MG/2ML IJ SOLN
INTRAMUSCULAR | Status: AC
  Filled 2023-11-11: qty 2

## 2023-11-11 MED ORDER — SUGAMMADEX SODIUM 200 MG/2ML IV SOLN
INTRAVENOUS | Status: AC
  Filled 2023-11-11: qty 2

## 2023-11-11 MED ORDER — LIDOCAINE 2% (20 MG/ML) 5 ML SYRINGE
INTRAMUSCULAR | Status: AC
  Filled 2023-11-11: qty 5

## 2023-11-11 MED ORDER — DEXAMETHASONE SODIUM PHOSPHATE 10 MG/ML IJ SOLN
INTRAMUSCULAR | Status: DC | PRN
  Administered 2023-11-11: 5 mg via INTRAVENOUS

## 2023-11-11 MED ORDER — FENTANYL CITRATE (PF) 250 MCG/5ML IJ SOLN
INTRAMUSCULAR | Status: AC
  Filled 2023-11-11: qty 5

## 2023-11-11 MED ORDER — MIDAZOLAM HCL 2 MG/2ML IJ SOLN
INTRAMUSCULAR | Status: DC | PRN
  Administered 2023-11-11: 2 mg via INTRAVENOUS

## 2023-11-11 MED ORDER — FENTANYL CITRATE (PF) 100 MCG/2ML IJ SOLN
INTRAMUSCULAR | Status: AC
  Filled 2023-11-11: qty 2

## 2023-11-11 MED ORDER — HYDROCODONE-ACETAMINOPHEN 5-325 MG PO TABS: 1.0000 | ORAL_TABLET | Freq: Three times a day (TID) | ORAL | 0 refills | Status: DC | PRN

## 2023-11-11 MED ORDER — HYDROMORPHONE HCL 1 MG/ML IJ SOLN
0.5000 mg | INTRAMUSCULAR | Status: AC | PRN
  Administered 2023-11-11 (×2): 0.5 mg via INTRAVENOUS

## 2023-11-11 MED ORDER — OXYCODONE HCL 5 MG/5ML PO SOLN: 5.0000 mg | Freq: Once | ORAL | Status: DC | PRN

## 2023-11-11 MED ORDER — CHLORHEXIDINE GLUCONATE 0.12 % MT SOLN
15.0000 mL | Freq: Once | OROMUCOSAL | Status: AC
  Administered 2023-11-11: 15 mL via OROMUCOSAL
  Filled 2023-11-11: qty 15

## 2023-11-11 MED ORDER — LACTATED RINGERS IV SOLN: INTRAVENOUS | Status: DC

## 2023-11-11 MED ORDER — FENTANYL CITRATE (PF) 100 MCG/2ML IJ SOLN
25.0000 ug | INTRAMUSCULAR | Status: DC | PRN
  Administered 2023-11-11 (×2): 50 ug via INTRAVENOUS

## 2023-11-11 MED ORDER — PROPOFOL 10 MG/ML IV BOLUS
INTRAVENOUS | Status: AC
  Filled 2023-11-11: qty 20

## 2023-11-11 MED ORDER — ORAL CARE MOUTH RINSE: 15.0000 mL | Freq: Once | OROMUCOSAL | Status: AC

## 2023-11-11 SURGICAL SUPPLY — 56 items
BAG COUNTER SPONGE SURGICOUNT (BAG) ×2 IMPLANT
BANDAGE ESMARK 6X9 LF (GAUZE/BANDAGES/DRESSINGS) ×2 IMPLANT
BNDG COHESIVE 6X5 TAN ST LF (GAUZE/BANDAGES/DRESSINGS) ×2 IMPLANT
BNDG ELASTIC 4X5.8 VLCR STR LF (GAUZE/BANDAGES/DRESSINGS) ×2 IMPLANT
BNDG ELASTIC 6INX 5YD STR LF (GAUZE/BANDAGES/DRESSINGS) ×2 IMPLANT
BNDG ESMARK 6X9 LF (GAUZE/BANDAGES/DRESSINGS) IMPLANT
BNDG GAUZE DERMACEA FLUFF 4 (GAUZE/BANDAGES/DRESSINGS) ×4 IMPLANT
BRUSH SCRUB EZ PLAIN DRY (MISCELLANEOUS) ×4 IMPLANT
CHLORAPREP W/TINT 26 (MISCELLANEOUS) ×2 IMPLANT
COVER SURGICAL LIGHT HANDLE (MISCELLANEOUS) ×4 IMPLANT
CUFF TOURN SGL QUICK 18X4 (TOURNIQUET CUFF) IMPLANT
CUFF TRNQT CYL 24X4X16.5-23 (TOURNIQUET CUFF) IMPLANT
CUFF TRNQT CYL 34X4.125X (TOURNIQUET CUFF) IMPLANT
DERMABOND ADVANCED .7 DNX12 (GAUZE/BANDAGES/DRESSINGS) IMPLANT
DRAPE C-ARM 42X72 X-RAY (DRAPES) IMPLANT
DRAPE C-ARMOR (DRAPES) ×2 IMPLANT
DRAPE U-SHAPE 47X51 STRL (DRAPES) ×2 IMPLANT
DRESSING MEPILEX FLEX 4X4 (GAUZE/BANDAGES/DRESSINGS) IMPLANT
DRSG ADAPTIC 3X8 NADH LF (GAUZE/BANDAGES/DRESSINGS) ×2 IMPLANT
DRSG MEPILEX FLEX 4X4 (GAUZE/BANDAGES/DRESSINGS) ×1 IMPLANT
ELECT REM PT RETURN 9FT ADLT (ELECTROSURGICAL) ×1 IMPLANT
ELECTRODE REM PT RTRN 9FT ADLT (ELECTROSURGICAL) ×2 IMPLANT
GAUZE SPONGE 4X4 12PLY STRL (GAUZE/BANDAGES/DRESSINGS) ×2 IMPLANT
GLOVE BIO SURGEON STRL SZ 6.5 (GLOVE) ×6 IMPLANT
GLOVE BIO SURGEON STRL SZ7.5 (GLOVE) ×8 IMPLANT
GLOVE BIOGEL PI IND STRL 6.5 (GLOVE) ×2 IMPLANT
GLOVE BIOGEL PI IND STRL 7.5 (GLOVE) ×2 IMPLANT
GOWN STRL REUS W/ TWL LRG LVL3 (GOWN DISPOSABLE) ×4 IMPLANT
GUIDEWIRE 2.8 THREAD 450 L ST (WIRE) IMPLANT
KIT BASIN OR (CUSTOM PROCEDURE TRAY) ×2 IMPLANT
KIT TURNOVER KIT B (KITS) ×2 IMPLANT
MANIFOLD NEPTUNE II (INSTRUMENTS) ×2 IMPLANT
NDL 22X1.5 STRL (OR ONLY) (MISCELLANEOUS) IMPLANT
NEEDLE 22X1.5 STRL (OR ONLY) (MISCELLANEOUS) IMPLANT
NS IRRIG 1000ML POUR BTL (IV SOLUTION) ×2 IMPLANT
PACK ORTHO EXTREMITY (CUSTOM PROCEDURE TRAY) ×2 IMPLANT
PAD ARMBOARD 7.5X6 YLW CONV (MISCELLANEOUS) ×4 IMPLANT
PADDING CAST COTTON 6X4 STRL (CAST SUPPLIES) ×6 IMPLANT
SPONGE T-LAP 18X18 ~~LOC~~+RFID (SPONGE) ×2 IMPLANT
STAPLER VISISTAT 35W (STAPLE) IMPLANT
STOCKINETTE IMPERVIOUS LG (DRAPES) ×2 IMPLANT
STRIP CLOSURE SKIN 1/2X4 (GAUZE/BANDAGES/DRESSINGS) IMPLANT
SUCTION TUBE FRAZIER 10FR DISP (SUCTIONS) IMPLANT
SUT ETHILON 3 0 PS 1 (SUTURE) IMPLANT
SUT MNCRL AB 3-0 PS2 18 (SUTURE) ×2 IMPLANT
SUT MON AB 2-0 CT1 36 (SUTURE) ×2 IMPLANT
SUT PDS AB 2-0 CT1 27 (SUTURE) IMPLANT
SUT VIC AB 0 CT1 27XBRD ANBCTR (SUTURE) IMPLANT
SUT VIC AB 2-0 CT1 TAPERPNT 27 (SUTURE) IMPLANT
SYR CONTROL 10ML LL (SYRINGE) IMPLANT
TOWEL GREEN STERILE (TOWEL DISPOSABLE) ×4 IMPLANT
TOWEL GREEN STERILE FF (TOWEL DISPOSABLE) ×4 IMPLANT
TUBE CONNECTING 12X1/4 (SUCTIONS) ×2 IMPLANT
UNDERPAD 30X36 HEAVY ABSORB (UNDERPADS AND DIAPERS) ×2 IMPLANT
WATER STERILE IRR 1000ML POUR (IV SOLUTION) ×4 IMPLANT
YANKAUER SUCT BULB TIP NO VENT (SUCTIONS) ×2 IMPLANT

## 2023-11-11 NOTE — Transfer of Care (Signed)
 Immediate Anesthesia Transfer of Care Note  Patient: Stacey Bates  Procedure(s) Performed: HARDWARE REMOVAL PELVIS (Right)  Patient Location: PACU  Anesthesia Type:General  Level of Consciousness: awake, alert , and oriented  Airway & Oxygen Therapy: Patient Spontanous Breathing and Patient connected to face mask oxygen  Post-op Assessment: Report given to RN and Post -op Vital signs reviewed and stable  Post vital signs: Reviewed and stable  Last Vitals:  Vitals Value Taken Time  BP 131/95 11/11/23 0951  Temp 36.6 C 11/11/23 0951  Pulse 95 11/11/23 0959  Resp 21 11/11/23 0959  SpO2 100 % 11/11/23 0959  Vitals shown include unfiled device data.  Last Pain:  Vitals:   11/11/23 0706  TempSrc: Oral  PainSc: 0-No pain         Complications: No notable events documented.

## 2023-11-11 NOTE — Interval H&P Note (Signed)
 History and Physical Interval Note:  11/11/2023 7:40 AM  Stacey Bates  has presented today for surgery, with the diagnosis of Painful orthopaedic hardware.  The various methods of treatment have been discussed with the patient and family. After consideration of risks, benefits and other options for treatment, the patient has consented to  Procedure(s): HARDWARE REMOVAL PELVIS (Right) as a surgical intervention.  The patient's history has been reviewed, patient examined, no change in status, stable for surgery.  I have reviewed the patient's chart and labs.  Questions were answered to the patient's satisfaction.     Caryn Bee P Shaquala Broeker
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
## 2023-11-11 NOTE — Discharge Instructions (Addendum)
 [facility] Discharge Instructions   General Discharge Instructions  WEIGHT BEARING STATUS: Weightbearing as tolerated  RANGE OF MOTION/ACTIVITY: unrestricted motion of the hips  Wound Care: You may remove your surgical dressing on post op day 2 (11/13/23). Incisions can be left open to air if there is no drainage. Once the incision is completely dry and without drainage, it may be left open to air out.  Showering may begin post op day 3 (11/14/23).  Clean incision gently with soap and water.  DVT/PE prophylaxis: None  Diet: as you were eating previously.  Can use over the counter stool softeners and bowel preparations, such as Miralax, to help with bowel movements.  Narcotics can be constipating.  Be sure to drink plenty of fluids  PAIN MEDICATION USE AND EXPECTATIONS  You have likely been given narcotic medications to help control your pain.  After a traumatic event that results in an fracture (broken bone) with or without surgery, it is ok to use narcotic pain medications to help control one's pain.  We understand that everyone responds to pain differently and each individual patient will be evaluated on a regular basis for the continued need for narcotic medications. Ideally, narcotic medication use should last no more than 6-8 weeks (coinciding with fracture healing).   As a patient it is your responsibility as well to monitor narcotic medication use and report the amount and frequency you use these medications when you come to your office visit.   We would also advise that if you are using narcotic medications, you should take a dose prior to therapy to maximize you participation.  IF YOU ARE ON NARCOTIC MEDICATIONS IT IS NOT PERMISSIBLE TO OPERATE A MOTOR VEHICLE (MOTORCYCLE/CAR/TRUCK/MOPED) OR HEAVY MACHINERY DO NOT MIX NARCOTICS WITH OTHER CNS (CENTRAL NERVOUS SYSTEM) DEPRESSANTS SUCH AS ALCOHOL   STOP SMOKING OR USING NICOTINE PRODUCTS!!!!  As discussed nicotine  severely impairs your body's ability to heal surgical and traumatic wounds but also impairs bone healing.  Wounds and bone heal by forming microscopic blood vessels (angiogenesis) and nicotine is a vasoconstrictor (essentially, shrinks blood vessels).  Therefore, if vasoconstriction occurs to these microscopic blood vessels they essentially disappear and are unable to deliver necessary nutrients to the healing tissue.  This is one modifiable factor that you can do to dramatically increase your chances of healing your injury.    (This means no smoking, no nicotine gum, patches, etc)  ICE AND ELEVATE INJURED/OPERATIVE EXTREMITY  Using ice and elevating the injured extremity above your heart can help with swelling and pain control.  Icing in a pulsatile fashion, such as 20 minutes on and 20 minutes off, can be followed.    Do not place ice directly on skin. Make sure there is a barrier between to skin and the ice pack.    Using frozen items such as frozen peas works well as the conform nicely to the are that needs to be iced.   CALL THE OFFICE WITH ANY QUESTIONS OR CONCERNS: [phone]   VISIT OUR WEBSITE FOR ADDITIONAL INFORMATION: [email or web address]   Discharge Wound Care Instructions  Do NOT apply any ointments, solutions or lotions to pin sites or surgical wounds.  These prevent needed drainage and even though solutions like hydrogen peroxide kill bacteria, they also damage cells lining the pin sites that help fight infection.  Applying lotions or ointments can keep the wounds moist and can cause them to breakdown and open up as well. This can increase the risk for  infection. When in doubt call the office.  If any drainage is noted, use foam dressing (mepilex) - These dressing supplies should be available at local medical supply stores [facility], [facility], etc) as well as Insurance claims handler ([facility], [facility], [employer], etc)  Once the incision is completely dry and without drainage, it  may be left open to air out.  Showering may begin 36-48 hours later.  Cleaning gently with soap and water.

## 2023-11-11 NOTE — Anesthesia Preprocedure Evaluation (Signed)
 Anesthesia Evaluation  Patient identified by MRN, date of birth, ID band Patient awake    Reviewed: Allergy & Precautions, NPO status , Patient's Chart, lab work & pertinent test results, reviewed documented beta blocker date and time   History of Anesthesia Complications Negative for: history of anesthetic complications  Airway Mallampati: II  TM Distance: >3 FB     Dental no notable dental hx.    Pulmonary neg COPD, Current Smoker   breath sounds clear to auscultation       Cardiovascular (-) angina (-) CAD and (-) Past MI negative cardio ROS  Rhythm:Regular Rate:Normal     Neuro/Psych neg Seizures PSYCHIATRIC DISORDERS         GI/Hepatic negative GI ROS, Neg liver ROS,,,  Endo/Other  negative endocrine ROS    Renal/GU negative Renal ROS     Musculoskeletal   Abdominal   Peds negative pediatric ROS (+)  Hematology   Anesthesia Other Findings   Reproductive/Obstetrics                             Anesthesia Physical Anesthesia Plan  ASA: 2  Anesthesia Plan: General   Post-op Pain Management:    Induction: Intravenous  PONV Risk Score and Plan: 2 and Ondansetron and Dexamethasone  Airway Management Planned: Oral ETT  Additional Equipment:   Intra-op Plan:   Post-operative Plan: Extubation in OR  Informed Consent: I have reviewed the patients History and Physical, chart, labs and discussed the procedure including the risks, benefits and alternatives for the proposed anesthesia with the patient or authorized representative who has indicated his/her understanding and acceptance.     Dental advisory given  Plan Discussed with: CRNA  Anesthesia Plan Comments:        Anesthesia Quick Evaluation

## 2023-11-11 NOTE — Anesthesia Postprocedure Evaluation (Signed)
 Anesthesia Post Note  Patient: Stacey Bates  Procedure(s) Performed: HARDWARE REMOVAL PELVIS (Right)     Patient location during evaluation: PACU Anesthesia Type: General Level of consciousness: awake and alert Pain management: pain level controlled Vital Signs Assessment: post-procedure vital signs reviewed and stable Respiratory status: spontaneous breathing, nonlabored ventilation, respiratory function stable and patient connected to nasal cannula oxygen Cardiovascular status: blood pressure returned to baseline and stable Postop Assessment: no apparent nausea or vomiting Anesthetic complications: no   No notable events documented.  Last Vitals:  Vitals:   11/11/23 1045 11/11/23 1100  BP: 100/69 100/79  Pulse: 66 73  Resp: 16 15  Temp:  36.8 C  SpO2: 99% 96%    Last Pain:  Vitals:   11/11/23 1100  TempSrc:   PainSc: 0-No pain                 Mariann Barter

## 2023-11-11 NOTE — Anesthesia Procedure Notes (Signed)
 Procedure Name: Intubation Date/Time: 11/11/2023 9:14 AM  Performed by: Thomasene Ripple, CRNAPre-anesthesia Checklist: Patient identified, Emergency Drugs available, Suction available and Patient being monitored Patient Re-evaluated:Patient Re-evaluated prior to induction Oxygen Delivery Method: Circle System Utilized Preoxygenation: Pre-oxygenation with 100% oxygen Induction Type: IV induction Ventilation: Mask ventilation without difficulty Laryngoscope Size: Miller and 3 Grade View: Grade I Tube type: Oral Tube size: 7.0 mm Number of attempts: 1 Airway Equipment and Method: Stylet and Oral airway Placement Confirmation: ETT inserted through vocal cords under direct vision, positive ETCO2 and breath sounds checked- equal and bilateral Secured at: 21 cm Tube secured with: Tape Dental Injury: Teeth and Oropharynx as per pre-operative assessment

## 2023-11-11 NOTE — Progress Notes (Signed)
+  BV on vaginal swab will rx flagyl,no sex or alcohol while taking  ?

## 2023-11-12 ENCOUNTER — Encounter (HOSPITAL_COMMUNITY): Payer: Self-pay | Admitting: Student

## 2023-11-24 DIAGNOSIS — S3282XD Multiple fractures of pelvis without disruption of pelvic ring, subsequent encounter for fracture with routine healing: Secondary | ICD-10-CM | POA: Diagnosis not present

## 2024-03-11 DIAGNOSIS — Z6821 Body mass index (BMI) 21.0-21.9, adult: Secondary | ICD-10-CM | POA: Diagnosis not present

## 2024-03-11 DIAGNOSIS — F32A Depression, unspecified: Secondary | ICD-10-CM | POA: Diagnosis not present

## 2024-03-11 DIAGNOSIS — F411 Generalized anxiety disorder: Secondary | ICD-10-CM | POA: Diagnosis not present

## 2024-03-18 ENCOUNTER — Encounter: Payer: Self-pay | Admitting: Advanced Practice Midwife

## 2024-04-26 DIAGNOSIS — G4700 Insomnia, unspecified: Secondary | ICD-10-CM | POA: Diagnosis not present

## 2024-04-26 DIAGNOSIS — F411 Generalized anxiety disorder: Secondary | ICD-10-CM | POA: Diagnosis not present

## 2024-04-26 DIAGNOSIS — Z6822 Body mass index (BMI) 22.0-22.9, adult: Secondary | ICD-10-CM | POA: Diagnosis not present

## 2024-04-26 DIAGNOSIS — F32A Depression, unspecified: Secondary | ICD-10-CM | POA: Diagnosis not present

## 2024-06-23 DIAGNOSIS — F19939 Other psychoactive substance use, unspecified with withdrawal, unspecified: Secondary | ICD-10-CM | POA: Diagnosis not present

## 2024-06-23 DIAGNOSIS — F1123 Opioid dependence with withdrawal: Secondary | ICD-10-CM | POA: Diagnosis not present

## 2024-06-23 DIAGNOSIS — R531 Weakness: Secondary | ICD-10-CM | POA: Diagnosis not present

## 2024-06-23 DIAGNOSIS — F19239 Other psychoactive substance dependence with withdrawal, unspecified: Secondary | ICD-10-CM | POA: Diagnosis not present

## 2024-06-23 DIAGNOSIS — F411 Generalized anxiety disorder: Secondary | ICD-10-CM | POA: Diagnosis not present

## 2024-06-23 DIAGNOSIS — R110 Nausea: Secondary | ICD-10-CM | POA: Diagnosis not present

## 2024-07-10 ENCOUNTER — Emergency Department (HOSPITAL_COMMUNITY)
Admission: EM | Admit: 2024-07-10 | Discharge: 2024-07-10 | Disposition: A | Discharge status: Home / Self Care | Attending: Emergency Medicine | Admitting: Emergency Medicine

## 2024-07-10 ENCOUNTER — Encounter (HOSPITAL_COMMUNITY): Payer: Self-pay

## 2024-07-10 ENCOUNTER — Emergency Department (HOSPITAL_COMMUNITY)

## 2024-07-10 ENCOUNTER — Other Ambulatory Visit: Payer: Self-pay

## 2024-07-10 DIAGNOSIS — M79604 Pain in right leg: Secondary | ICD-10-CM | POA: Insufficient documentation

## 2024-07-10 DIAGNOSIS — S3289XA Fracture of other parts of pelvis, initial encounter for closed fracture: Secondary | ICD-10-CM | POA: Diagnosis not present

## 2024-07-10 DIAGNOSIS — S161XXA Strain of muscle, fascia and tendon at neck level, initial encounter: Secondary | ICD-10-CM | POA: Diagnosis not present

## 2024-07-10 DIAGNOSIS — M79605 Pain in left leg: Secondary | ICD-10-CM | POA: Insufficient documentation

## 2024-07-10 DIAGNOSIS — S0990XA Unspecified injury of head, initial encounter: Secondary | ICD-10-CM | POA: Diagnosis not present

## 2024-07-10 DIAGNOSIS — F10929 Alcohol use, unspecified with intoxication, unspecified: Secondary | ICD-10-CM | POA: Diagnosis not present

## 2024-07-10 DIAGNOSIS — R9431 Abnormal electrocardiogram [ECG] [EKG]: Secondary | ICD-10-CM | POA: Diagnosis not present

## 2024-07-10 DIAGNOSIS — S060XAA Concussion with loss of consciousness status unknown, initial encounter: Secondary | ICD-10-CM | POA: Insufficient documentation

## 2024-07-10 DIAGNOSIS — S060X0A Concussion without loss of consciousness, initial encounter: Secondary | ICD-10-CM | POA: Diagnosis not present

## 2024-07-10 DIAGNOSIS — Y92410 Unspecified street and highway as the place of occurrence of the external cause: Secondary | ICD-10-CM | POA: Insufficient documentation

## 2024-07-10 DIAGNOSIS — S199XXA Unspecified injury of neck, initial encounter: Secondary | ICD-10-CM | POA: Diagnosis not present

## 2024-07-10 MED ORDER — KETOROLAC TROMETHAMINE 15 MG/ML IJ SOLN
15.0000 mg | Freq: Once | INTRAMUSCULAR | Status: AC
  Administered 2024-07-10: 15 mg via INTRAVENOUS
  Filled 2024-07-10: qty 1

## 2024-07-10 MED ORDER — FENTANYL CITRATE (PF) 50 MCG/ML IJ SOSY
50.0000 ug | PREFILLED_SYRINGE | Freq: Once | INTRAMUSCULAR | Status: AC
  Administered 2024-07-10: 50 ug via INTRAVENOUS
  Filled 2024-07-10: qty 1

## 2024-07-10 NOTE — ED Notes (Signed)
 C- collar removed per Dr. Hadley order

## 2024-07-10 NOTE — ED Notes (Signed)
 Pt ambulated with minimal assistance from staff
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
## 2024-07-15 ENCOUNTER — Other Ambulatory Visit: Payer: Self-pay

## 2024-07-15 ENCOUNTER — Emergency Department (HOSPITAL_COMMUNITY)
Admission: EM | Admit: 2024-07-15 | Discharge: 2024-07-15 | Discharge status: Against Medical Advice | Attending: Emergency Medicine | Admitting: Emergency Medicine

## 2024-07-15 ENCOUNTER — Emergency Department (HOSPITAL_COMMUNITY)

## 2024-07-15 ENCOUNTER — Encounter (HOSPITAL_COMMUNITY): Payer: Self-pay | Admitting: *Deleted

## 2024-07-15 DIAGNOSIS — M542 Cervicalgia: Secondary | ICD-10-CM | POA: Diagnosis not present

## 2024-07-15 DIAGNOSIS — S3991XA Unspecified injury of abdomen, initial encounter: Secondary | ICD-10-CM | POA: Diagnosis not present

## 2024-07-15 DIAGNOSIS — Z5321 Procedure and treatment not carried out due to patient leaving prior to being seen by health care provider: Secondary | ICD-10-CM | POA: Insufficient documentation

## 2024-07-15 DIAGNOSIS — R110 Nausea: Secondary | ICD-10-CM | POA: Insufficient documentation

## 2024-07-15 DIAGNOSIS — M545 Low back pain, unspecified: Secondary | ICD-10-CM | POA: Diagnosis not present

## 2024-07-15 DIAGNOSIS — Y92410 Unspecified street and highway as the place of occurrence of the external cause: Secondary | ICD-10-CM | POA: Diagnosis not present

## 2024-07-15 DIAGNOSIS — R1030 Lower abdominal pain, unspecified: Secondary | ICD-10-CM | POA: Insufficient documentation

## 2024-07-15 DIAGNOSIS — M549 Dorsalgia, unspecified: Secondary | ICD-10-CM | POA: Insufficient documentation

## 2024-07-15 DIAGNOSIS — R1032 Left lower quadrant pain: Secondary | ICD-10-CM | POA: Diagnosis not present

## 2024-07-15 LAB — CBC WITH DIFFERENTIAL/PLATELET
Abs Immature Granulocytes: 0.03 K/uL (ref 0.00–0.07)
Basophils Absolute: 0 K/uL (ref 0.0–0.1)
Basophils Relative: 0 %
Eosinophils Absolute: 0.1 K/uL (ref 0.0–0.5)
Eosinophils Relative: 1 %
HCT: 39.3 % (ref 36.0–46.0)
Hemoglobin: 12.6 g/dL (ref 12.0–15.0)
Immature Granulocytes: 0 %
Lymphocytes Relative: 15 %
Lymphs Abs: 1.5 K/uL (ref 0.7–4.0)
MCH: 27.8 pg (ref 26.0–34.0)
MCHC: 32.1 g/dL (ref 30.0–36.0)
MCV: 86.8 fL (ref 80.0–100.0)
Monocytes Absolute: 0.3 K/uL (ref 0.1–1.0)
Monocytes Relative: 4 %
Neutro Abs: 7.8 K/uL — ABNORMAL HIGH (ref 1.7–7.7)
Neutrophils Relative %: 80 %
Platelets: 283 K/uL (ref 150–400)
RBC: 4.53 MIL/uL (ref 3.87–5.11)
RDW: 14 % (ref 11.5–15.5)
WBC: 9.8 K/uL (ref 4.0–10.5)
nRBC: 0 % (ref 0.0–0.2)

## 2024-07-15 LAB — COMPREHENSIVE METABOLIC PANEL WITH GFR
ALT: 13 U/L (ref 0–44)
AST: 18 U/L (ref 15–41)
Albumin: 4.3 g/dL (ref 3.5–5.0)
Alkaline Phosphatase: 66 U/L (ref 38–126)
Anion gap: 11 (ref 5–15)
BUN: 6 mg/dL (ref 6–20)
CO2: 25 mmol/L (ref 22–32)
Calcium: 9.4 mg/dL (ref 8.9–10.3)
Chloride: 103 mmol/L (ref 98–111)
Creatinine, Ser: 0.83 mg/dL (ref 0.44–1.00)
GFR, Estimated: >60 mL/min (ref >60)
Glucose, Bld: 87 mg/dL (ref 70–99)
Potassium: 3.8 mmol/L (ref 3.5–5.1)
Sodium: 139 mmol/L (ref 135–145)
Total Bilirubin: 0.7 mg/dL (ref 0.0–1.2)
Total Protein: 7.9 g/dL (ref 6.5–8.1)

## 2024-07-15 LAB — URINALYSIS, ROUTINE W REFLEX MICROSCOPIC
Bilirubin Urine: NEGATIVE
Glucose, UA: NEGATIVE mg/dL
Ketones, ur: NEGATIVE mg/dL
Nitrite: NEGATIVE
Protein, ur: 30 mg/dL — AB
Specific Gravity, Urine: 1.02 (ref 1.005–1.030)
pH: 5 (ref 5.0–8.0)

## 2024-07-15 LAB — PREGNANCY, URINE: Preg Test, Ur: NEGATIVE

## 2024-07-15 LAB — LIPASE, BLOOD: Lipase: 22 U/L (ref 11–51)

## 2024-07-15 MED ORDER — IOHEXOL 350 MG/ML SOLN
60.0000 mL | Freq: Once | INTRAVENOUS | Status: AC | PRN
  Administered 2024-07-15: 60 mL via INTRAVENOUS
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
## 2024-07-15 NOTE — ED Triage Notes (Signed)
 N and v  and abd cramps since Saturday    no temp  lmp2 weeks ago

## 2024-07-15 NOTE — ED Notes (Signed)
 Patient states she is going home and will follow up at a different hospital tomorrow. RN explained we didn't want her to leave but could not hold her here.

## 2024-09-19 ENCOUNTER — Ambulatory Visit: Admitting: Adult Health

## 2024-09-23 ENCOUNTER — Ambulatory Visit: Admitting: Adult Health
